# Patient Record
Sex: Female | Born: 1970 | Race: White | Hispanic: No | Marital: Married | Smoking: Former smoker
Health system: Southern US, Community
[De-identification: ages and names within clinical notes are randomized; demographics above are authoritative.]

## PROBLEM LIST (undated history)

## (undated) DIAGNOSIS — N921 Excessive and frequent menstruation with irregular cycle: Secondary | ICD-10-CM

## (undated) DIAGNOSIS — B029 Zoster without complications: Secondary | ICD-10-CM

## (undated) DIAGNOSIS — N83202 Unspecified ovarian cyst, left side: Secondary | ICD-10-CM

## (undated) DIAGNOSIS — M1990 Unspecified osteoarthritis, unspecified site: Secondary | ICD-10-CM

## (undated) HISTORY — DX: Excessive and frequent menstruation with irregular cycle: N92.1

## (undated) HISTORY — DX: Unspecified osteoarthritis, unspecified site: M19.90

## (undated) HISTORY — PX: TUBAL LIGATION: SHX77

## (undated) HISTORY — DX: Unspecified ovarian cyst, left side: N83.202

## (undated) HISTORY — DX: Zoster without complications: B02.9

## (undated) HISTORY — PX: APPENDECTOMY: SHX54

---

## 1998-11-22 HISTORY — PX: LAPAROSCOPIC OOPHERECTOMY: SHX6507

## 2012-06-16 ENCOUNTER — Other Ambulatory Visit: Payer: Self-pay | Admitting: Gastroenterology

## 2012-06-16 DIAGNOSIS — R140 Abdominal distension (gaseous): Secondary | ICD-10-CM

## 2012-06-21 ENCOUNTER — Ambulatory Visit
Admission: RE | Admit: 2012-06-21 | Discharge: 2012-06-21 | Disposition: A | Discharge status: Home / Self Care | Payer: BC Managed Care – PPO | Source: Ambulatory Visit | Attending: Gastroenterology | Admitting: Gastroenterology

## 2012-06-21 DIAGNOSIS — R140 Abdominal distension (gaseous): Secondary | ICD-10-CM

## 2012-12-13 ENCOUNTER — Encounter: Payer: Self-pay | Admitting: Family Medicine

## 2013-08-25 IMAGING — US US ABDOMEN COMPLETE
1 series · 13 of 25 positions shown · non-contrast
Comparison: None.

CLINICAL DATA: Abdominal bloating, former smoking history

COMPLETE ABDOMINAL ULTRASOUND

[Series 1: us abdomen complete · 0.26mm/px · 13 of 74 slices shown]
[im 1/74]
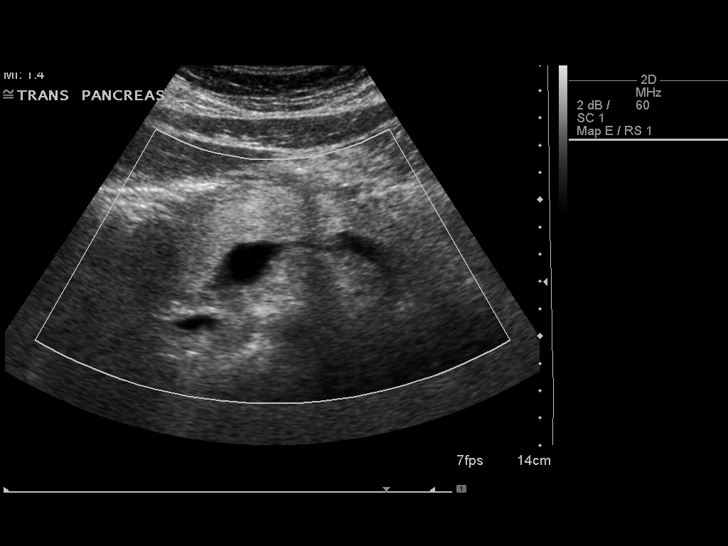
[im 7/74]
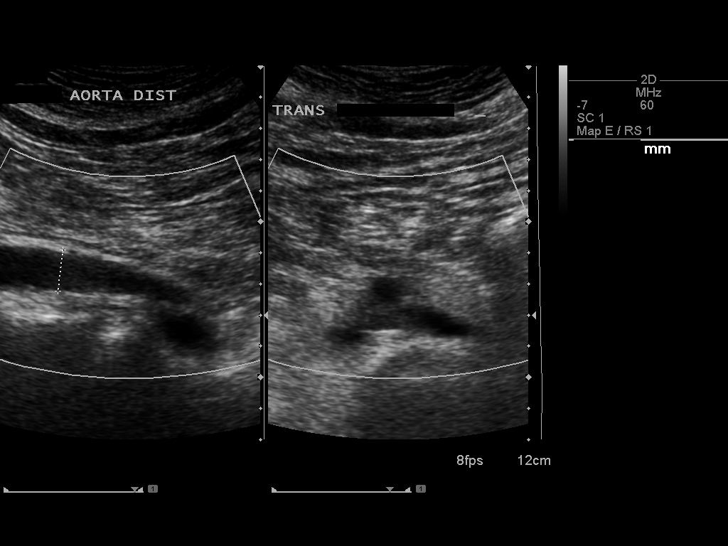
[im 13/74]
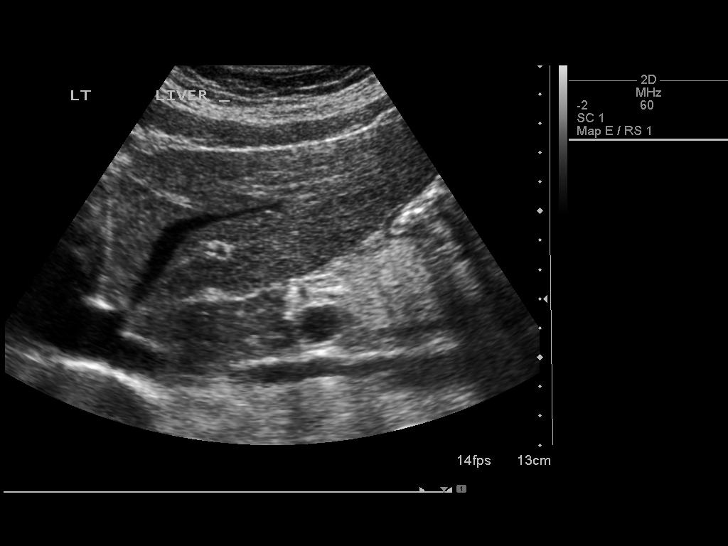
[im 19/74]
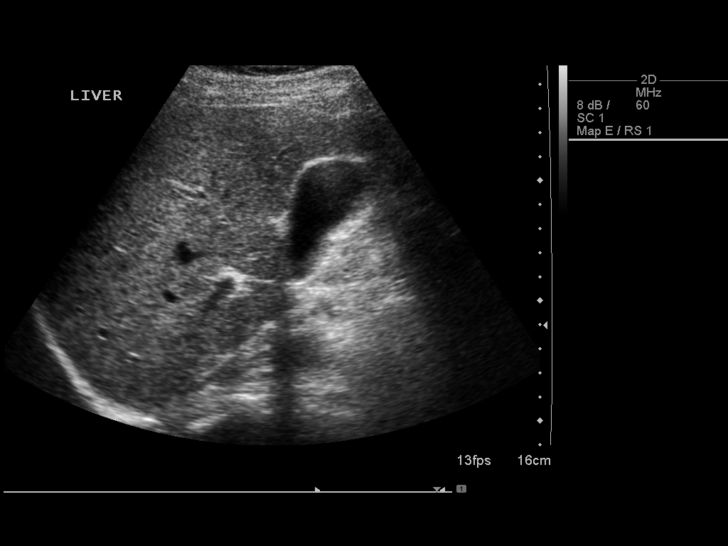
[im 25/74]
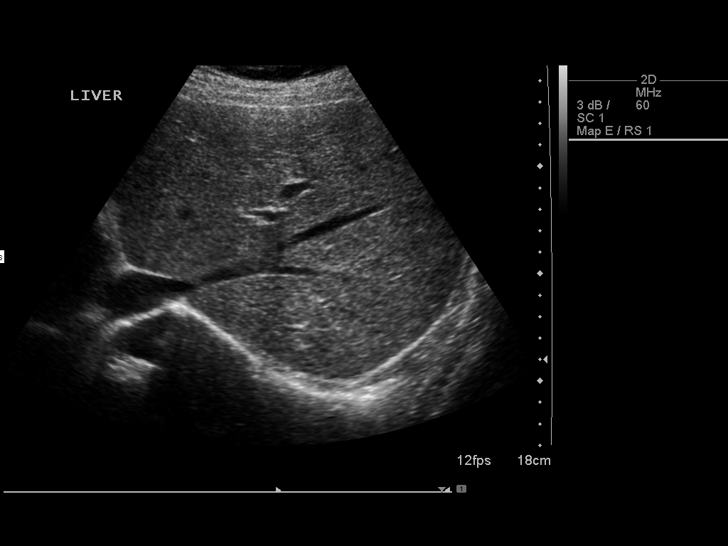
[im 31/74]
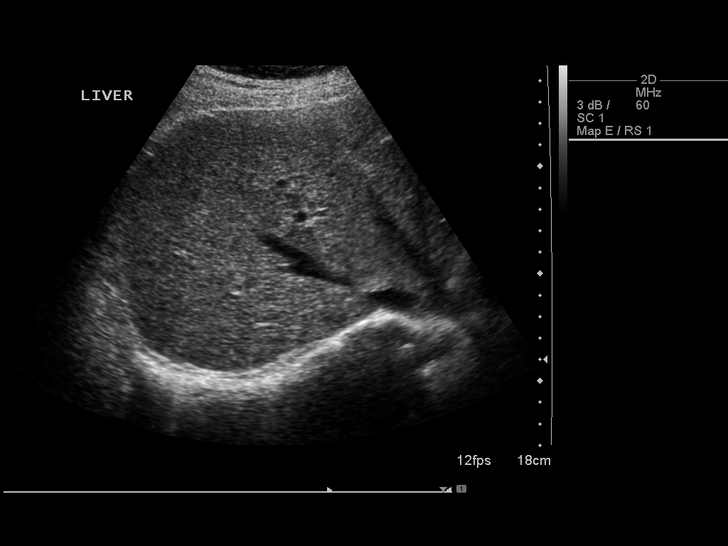
[im 37/74]
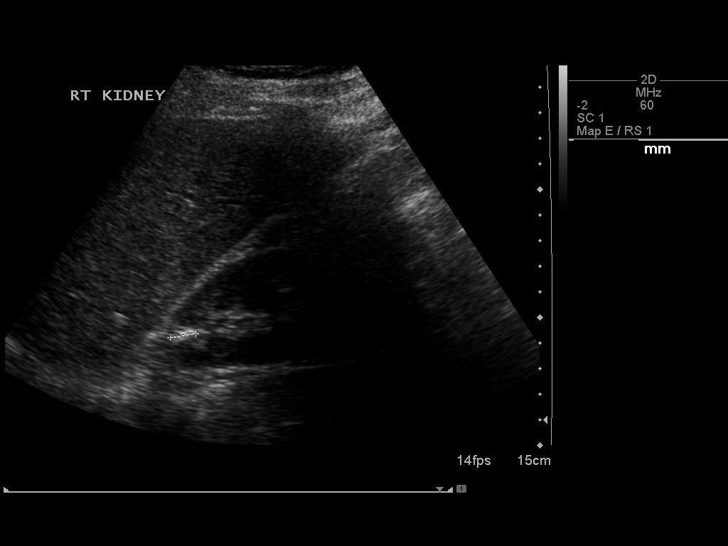
[im 43/74]
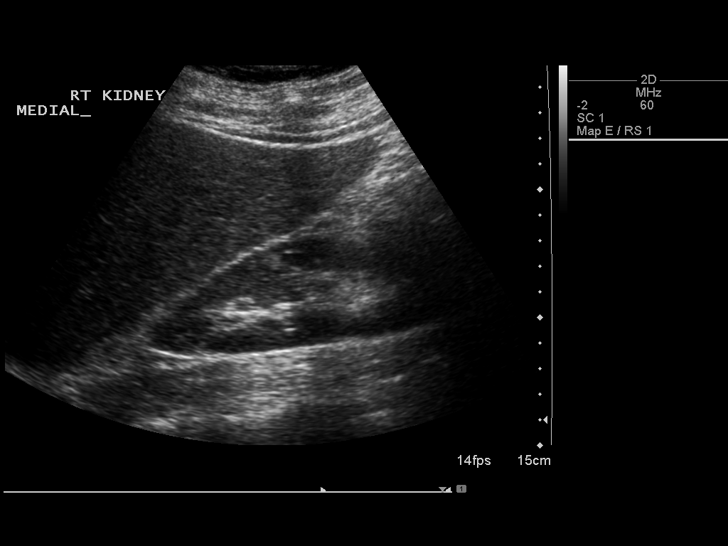
[im 49/74]
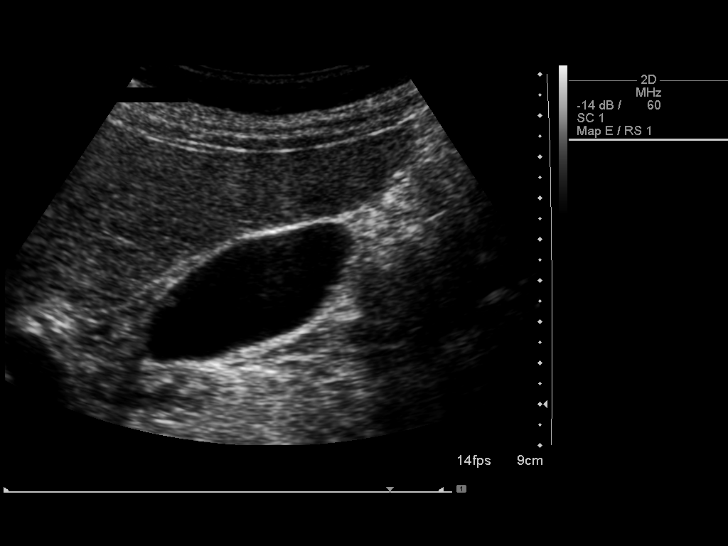
[im 55/74]
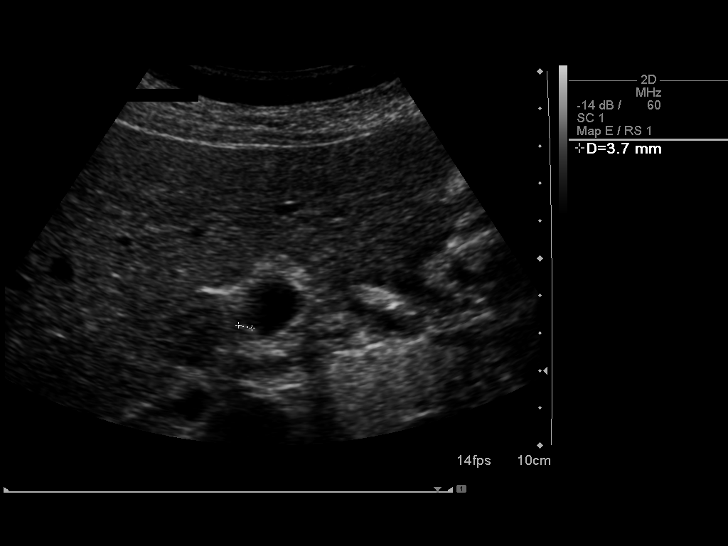
[im 61/74]
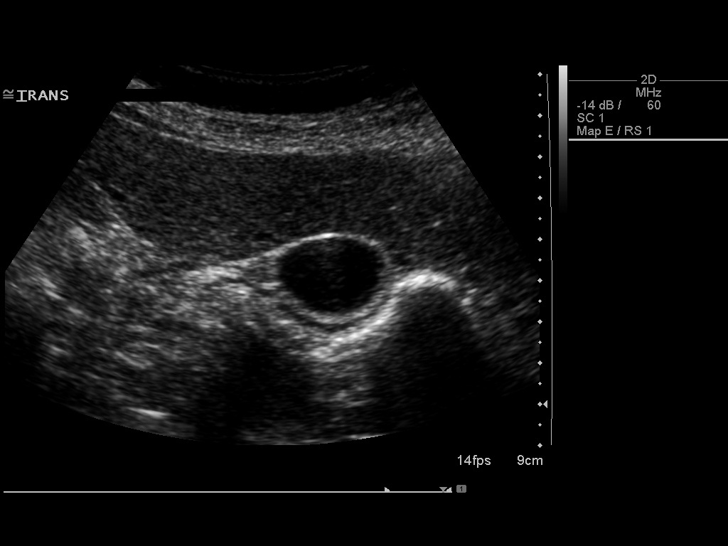
[im 67/74]
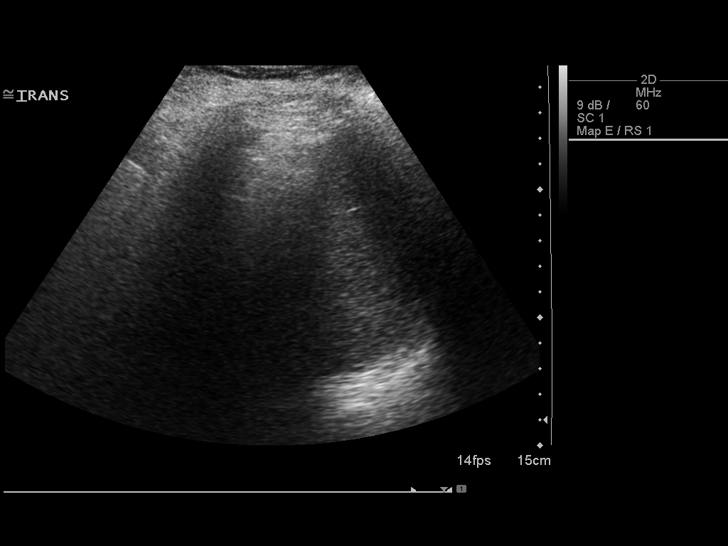
[im 74/74]
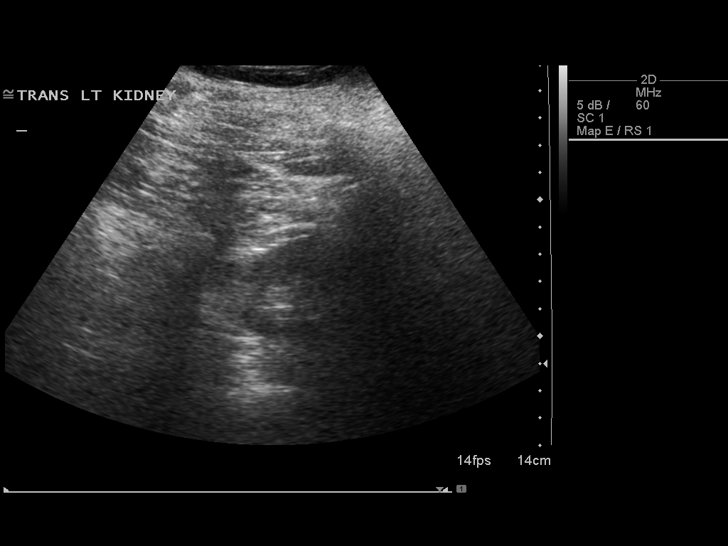

[13 of 25 positions shown; findings below may reference images not displayed]

FINDINGS: Gallbladder:  The gallbladder is visualized and no gallstones are
noted.  There does appear to be a single soft tissue focus within
the gallbladder which does not move and most like represents a 4 mm
gallbladder polyp.  There is no pain over the gallbladder with
compression.

Common bile duct:  The common bile duct is normal measuring 3.1 mm
in diameter.

Liver:  The liver has a normal echogenic pattern.  No ductal
dilatation is seen.

IVC:  Appears normal.

Pancreas:  No focal abnormality seen.

Spleen:  The spleen is normal measuring 3.9 cm sagittally.

Right Kidney:  No hydronephrosis is seen.  The right kidney
measures 9.9 cm sagittally.  A probable calculus which is
nonobstructing in the upper pole measures 10 mm in diameter.

Left Kidney:  No hydronephrosis is seen.  The left kidney measures
11.3 cm.

Abdominal aorta:  The abdominal aorta is normal in caliber.
IMPRESSION: 1.  No gallstones.  Probable 4 mm gallbladder polyp.  No pain over
the gallbladder.
2.  No ductal dilatation.
3.  Nonobstructing 10 mm right upper pole renal calculus.

## 2016-03-11 ENCOUNTER — Encounter: Payer: Self-pay | Admitting: Family Medicine

## 2016-03-29 HISTORY — PX: ENDOMETRIAL BIOPSY: SHX622

## 2018-02-06 ENCOUNTER — Emergency Department (HOSPITAL_BASED_OUTPATIENT_CLINIC_OR_DEPARTMENT_OTHER)
Admit: 2018-02-06 | Discharge: 2018-02-06 | Disposition: A | Discharge status: Home / Self Care | Payer: BLUE CROSS/BLUE SHIELD | Attending: Emergency Medicine | Admitting: Emergency Medicine

## 2018-02-06 ENCOUNTER — Emergency Department (HOSPITAL_COMMUNITY)
Admission: EM | Admit: 2018-02-06 | Discharge: 2018-02-06 | Disposition: A | Discharge status: Home / Self Care | Payer: BLUE CROSS/BLUE SHIELD | Source: Home / Self Care | Attending: Emergency Medicine | Admitting: Emergency Medicine

## 2018-02-06 ENCOUNTER — Emergency Department (HOSPITAL_COMMUNITY)
Admission: EM | Admit: 2018-02-06 | Discharge: 2018-02-06 | Disposition: A | Discharge status: Home / Self Care | Payer: BLUE CROSS/BLUE SHIELD | Attending: Emergency Medicine | Admitting: Emergency Medicine

## 2018-02-06 ENCOUNTER — Encounter (HOSPITAL_COMMUNITY): Payer: Self-pay

## 2018-02-06 ENCOUNTER — Other Ambulatory Visit: Payer: Self-pay

## 2018-02-06 DIAGNOSIS — M25561 Pain in right knee: Secondary | ICD-10-CM

## 2018-02-06 DIAGNOSIS — M79609 Pain in unspecified limb: Secondary | ICD-10-CM

## 2018-02-06 DIAGNOSIS — M79661 Pain in right lower leg: Secondary | ICD-10-CM | POA: Diagnosis present

## 2018-02-06 MED ORDER — IBUPROFEN 600 MG PO TABS: 600.0000 mg | ORAL_TABLET | Freq: Four times a day (QID) | ORAL | 0 refills | Status: DC | PRN

## 2018-02-06 MED ORDER — IBUPROFEN 800 MG PO TABS
800.0000 mg | ORAL_TABLET | Freq: Once | ORAL | Status: AC
  Administered 2018-02-06: 800 mg via ORAL
  Filled 2018-02-06: qty 1

## 2018-02-06 MED ORDER — CYCLOBENZAPRINE HCL 10 MG PO TABS: 10.0000 mg | ORAL_TABLET | Freq: Two times a day (BID) | ORAL | 0 refills | Status: DC | PRN

## 2018-02-06 NOTE — ED Triage Notes (Signed)
Pt endorses right leg pain just below the knee that feels like a "charlie horse" Unable to palpate pedal pulse. Leg is warm and dry.

## 2018-02-06 NOTE — ED Provider Notes (Signed)
Patient placed in Quick Look pathway, seen and evaluated   Chief Complaint: knee/leg pain  HPI:   Medial knee pain now radiating to right calf and posterior knee worse with walking and touch, alleviating with rest. Has been sitting down at work for long periods of time working on a project. No h/o DVT/PE, estrogen use, malignancy, recent immobilization/travel.  No recent trauma or exertional activity.   ROS: No fevers, chills, numbness, paresthesias, swelling, redness to extremity.   Physical Exam:   Gen: No distress  Neuro: Awake and Alert  Skin: Warm    Focused Exam: trace pitting edema symmetric bilaterally to shins. 2+ Dp and PT pulses bilaterally. TTP to right calf, medial knee and posterior distal thigh. Strength and sensation grossly intact in lower extremities. Varicosities noted to medial aspect of knee/proximal lower leg.    R LE vascular ultrasound ordered. Initiation of care has begun. The patient has been counseled on the process, plan, and necessity for staying for the completion/evaluation, and the remainder of the medical screening examination    Briana Nielsen, Briana Mamaril J, PA-C 02/06/18 1511    Melene PlanFloyd, Dan, DO 02/06/18 1529

## 2018-02-06 NOTE — ED Notes (Signed)
Pt called in lobby. No response 

## 2018-02-06 NOTE — ED Notes (Signed)
Pt. Came to nurse 1st and stated, Lavenia Atlasve been here for awhile and nobody has called. Pt was discharge due to no answer. Pt stated, Im hard of hearing and didn't hear anything. Pt put back into the system. Charge nurse notified.

## 2018-02-06 NOTE — Progress Notes (Signed)
Right lower extremity venous duplex has been completed. Negative for DVT. Results were given to Eyvonne MechanicJeffrey Hedges PA.  02/06/18 4:52 PM Olen CordialGreg Javonta Gronau RVT

## 2018-02-06 NOTE — ED Notes (Signed)
Second call in lobby. No response. 

## 2018-02-06 NOTE — ED Provider Notes (Signed)
[facility] EMERGENCY DEPARTMENT Provider Note   CSN: [id number] Arrival date & time: 02/06/18  1420     History   Chief Complaint Chief Complaint  Patient presents with  . Leg Pain    HPI Briana Nielsen is a 47 y.o. female.  HPI  The patient is a 47 year old female, she denies any significant prior medical history though she does report that her mother was recently diagnosed with polymyositis.  The patient reports that she has had approximately 1 year of aches and pains over her muscles, she has become sensitive to touch even with her cat stepping on her legs it does cause some pain.  That being said she has stayed fairly docile with her job, she sits at a desk, she does not do much in the way of exercise ever.  Over the last couple of days she has had increasing amounts of pain on the medial surface of her right knee, she felt like this might be related to some varicose veins though she did not see any.  The pain increased and it became gradually more difficult for her to bend her knee and move around.  She has not been taking any specific medications for this but became concerned that she may have a blood clot.  She denies fevers chills redness or any other symptoms including coughing shortness of breath or chest pain  History reviewed. No pertinent past medical history.  There are no active problems to display for this patient.   History reviewed. No pertinent surgical history.  OB History    No data available       Home Medications    Prior to Admission medications   Medication Sig Start Date End Date Taking? Authorizing Provider  cyclobenzaprine (FLEXERIL) 10 MG tablet Take 1 tablet (10 mg total) by mouth 2 (two) times daily as needed for muscle spasms. 02/06/18   Eber Hong, MD  ibuprofen (ADVIL,MOTRIN) 600 MG tablet Take 1 tablet (600 mg total) by mouth every 6 (six) hours as needed. 02/06/18   Eber Hong, MD    Family History History reviewed.  No pertinent family history.  Social History Social History   Tobacco Use  . Smoking status: Never Smoker  Substance Use Topics  . Alcohol use: No    Frequency: Never  . Drug use: No     Allergies   Patient has no known allergies.   Review of Systems Review of Systems  All other systems reviewed and are negative.    Physical Exam Updated Vital Signs BP (!) 154/100 (BP Location: Right Arm)   Pulse 80   Temp 98 F (36.7 C) (Oral)   Resp 16   SpO2 99%   Physical Exam  Constitutional: She appears well-developed and well-nourished. No distress.  HENT:  Head: Normocephalic and atraumatic.  Mouth/Throat: Oropharynx is clear and moist. No oropharyngeal exudate.  Eyes: Conjunctivae and EOM are normal. Pupils are equal, round, and reactive to light. Right eye exhibits no discharge. Left eye exhibits no discharge. No scleral icterus.  Neck: Normal range of motion. Neck supple. No JVD present. No thyromegaly present.  Cardiovascular: Normal rate, regular rhythm, normal heart sounds and intact distal pulses. Exam reveals no gallop and no friction rub.  No murmur heard. Pulmonary/Chest: Effort normal and breath sounds normal. No respiratory distress. She has no wheezes. She has no rales.  Abdominal: Soft. Bowel sounds are normal. She exhibits no distension and no mass. There is no tenderness.  Musculoskeletal:  Normal range of motion. She exhibits edema and tenderness ( Tenderness to palpation over the medial surface of the right knee, there is minimal pain with movement of the patella, she is able to flex and extend her knee with minimal difficulty).  Bilateral mild lower extremity edema.  Full range of motion of the bilateral lower extremities with only mild tenderness with range of motion of the right knee.  No tenderness with range of motion of the bilateral ankles or hips  Lymphadenopathy:    She has no cervical adenopathy.  Neurological: She is alert. Coordination normal.    Skin: Skin is warm and dry. No rash noted. No erythema.  Psychiatric: She has a normal mood and affect. Her behavior is normal.  Nursing note and vitals reviewed.    ED Treatments / Results  Labs (all labs ordered are listed, but only abnormal results are displayed) Labs Reviewed - No data to display    Radiology No results found.  Procedures Procedures (including critical care time)  Medications Ordered in ED Medications  ibuprofen (ADVIL,MOTRIN) tablet 800 mg (not administered)     Initial Impression / Assessment and Plan / ED Course  I have reviewed the triage vital signs and the nursing notes.  Pertinent labs & imaging results that were available during my care of the patient were reviewed by me and considered in my medical decision making (see chart for details).     The patient was screened on arrival by physician assistant, and ultrasound was appropriately ordered of the right lower extremity which was negative for venous thromboembolism.  The patient does have some tenderness around the knee, this raises suspicion for a muscular skeletal etiology but furthermore the patient's family history of rheumatologic disease suggest that she may have something underlying as well.  She may have underlying disease as well and will need to be followed up with an orthopedist, may need physical therapy, family doctor referral given as well [facility]  Final Clinical Impressions(s) / ED Diagnoses   Final diagnoses:  Acute pain of right knee    ED Discharge Orders        Ordered    ibuprofen (ADVIL,MOTRIN) 600 MG tablet  Every 6 hours PRN     02/06/18 2033    cyclobenzaprine (FLEXERIL) 10 MG tablet  2 times daily PRN     02/06/18 2033       Eber HongMiller, Christopher Glasscock, MD 02/06/18 2035

## 2018-02-06 NOTE — Discharge Instructions (Signed)
Ibuprofen every 8 hours as needed for pain Flexeril 10mg  at night before bed.

## 2018-02-13 DIAGNOSIS — M25561 Pain in right knee: Secondary | ICD-10-CM | POA: Diagnosis not present

## 2018-02-21 ENCOUNTER — Ambulatory Visit (INDEPENDENT_AMBULATORY_CARE_PROVIDER_SITE_OTHER): Payer: Self-pay | Admitting: Family Medicine

## 2018-02-21 ENCOUNTER — Encounter: Payer: Self-pay | Admitting: Family Medicine

## 2018-02-21 VITALS — BP 131/85 | HR 74 | Temp 98.1°F | Ht 67.0 in | Wt 226.8 lb

## 2018-02-21 DIAGNOSIS — Z130 Encounter for screening for diseases of the blood and blood-forming organs and certain disorders involving the immune mechanism: Secondary | ICD-10-CM

## 2018-02-21 DIAGNOSIS — Z2821 Immunization not carried out because of patient refusal: Secondary | ICD-10-CM | POA: Insufficient documentation

## 2018-02-21 DIAGNOSIS — Z800 Family history of malignant neoplasm of digestive organs: Secondary | ICD-10-CM | POA: Insufficient documentation

## 2018-02-21 DIAGNOSIS — R140 Abdominal distension (gaseous): Secondary | ICD-10-CM

## 2018-02-21 DIAGNOSIS — N951 Menopausal and female climacteric states: Secondary | ICD-10-CM

## 2018-02-21 DIAGNOSIS — Z13220 Encounter for screening for lipoid disorders: Secondary | ICD-10-CM

## 2018-02-21 DIAGNOSIS — I83811 Varicose veins of right lower extremities with pain: Secondary | ICD-10-CM

## 2018-02-21 DIAGNOSIS — Z7689 Persons encountering health services in other specified circumstances: Secondary | ICD-10-CM

## 2018-02-21 DIAGNOSIS — Z6835 Body mass index (BMI) 35.0-35.9, adult: Secondary | ICD-10-CM | POA: Insufficient documentation

## 2018-02-21 DIAGNOSIS — M1711 Unilateral primary osteoarthritis, right knee: Secondary | ICD-10-CM

## 2018-02-21 DIAGNOSIS — Z114 Encounter for screening for human immunodeficiency virus [HIV]: Secondary | ICD-10-CM

## 2018-02-21 LAB — BAYER DCA HB A1C WAIVED: HB A1C (BAYER DCA - WAIVED): 5.2 % (ref <7.0)

## 2018-02-21 MED ORDER — VALACYCLOVIR HCL 1 G PO TABS: 1000.0000 mg | ORAL_TABLET | Freq: Two times a day (BID) | ORAL | 2 refills | Status: AC

## 2018-02-21 NOTE — Patient Instructions (Addendum)
You had labs performed today.  You will be contacted with the results of the labs once they are available, usually in the next 3 days for routine lab work.  Schedule your mammogram when you check out today.  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  I have placed a referral to the gastroenterologist for your abdominal bloating and family history of anal cancer.  Health Maintenance, Female Adopting a healthy lifestyle and getting preventive care can go a long way to promote health and wellness. Talk with your health care provider about what schedule of regular examinations is right for you. This is a good chance for you to check in with your provider about disease prevention and staying healthy. In between checkups, there are plenty of things you can do on your own. Experts have done a lot of research about which lifestyle changes and preventive measures are most likely to keep you healthy. Ask your health care provider for more information. Weight and diet Eat a healthy diet  Be sure to include plenty of vegetables, fruits, low-fat dairy products, and lean protein.  Do not eat a lot of foods high in solid fats, added sugars, or salt.  Get regular exercise. This is one of the most important things you can do for your health. ? Most adults should exercise for at least 150 minutes each week. The exercise should increase your heart rate and make you sweat (moderate-intensity exercise). ? Most adults should also do strengthening exercises at least twice a week. This is in addition to the moderate-intensity exercise.  Maintain a healthy weight  Body mass index (BMI) is a measurement that can be used to identify possible weight problems. It estimates body fat based on height and weight. Your health care provider can help determine your BMI and help you achieve or maintain a healthy weight.  For females 37 years of age and older: ? A BMI below 18.5 is considered underweight. ? A BMI of 18.5 to  24.9 is normal. ? A BMI of 25 to 29.9 is considered overweight. ? A BMI of 30 and above is considered obese.  Watch levels of cholesterol and blood lipids  You should start having your blood tested for lipids and cholesterol at 49 years of age, then have this test every 5 years.  You may need to have your cholesterol levels checked more often if: ? Your lipid or cholesterol levels are high. ? You are older than 49 years of age. ? You are at high risk for heart disease.  Cancer screening Lung Cancer  Lung cancer screening is recommended for adults 71-2 years old who are at high risk for lung cancer because of a history of smoking.  A yearly low-dose CT scan of the lungs is recommended for people who: ? Currently smoke. ? Have quit within the past 15 years. ? Have at least a 30-pack-year history of smoking. A pack year is smoking an average of one pack of cigarettes a day for 1 year.  Yearly screening should continue until it has been 15 years since you quit.  Yearly screening should stop if you develop a health problem that would prevent you from having lung cancer treatment.  Breast Cancer  Practice breast self-awareness. This means understanding how your breasts normally appear and feel.  It also means doing regular breast self-exams. Let your health care provider know about any changes, no matter how small.  If you are in your 20s or 30s, you should  have a clinical breast exam (CBE) by a health care provider every 1-3 years as part of a regular health exam.  If you are 18 or older, have a CBE every year. Also consider having a breast X-ray (mammogram) every year.  If you have a family history of breast cancer, talk to your health care provider about genetic screening.  If you are at high risk for breast cancer, talk to your health care provider about having an MRI and a mammogram every year.  Breast cancer gene (BRCA) assessment is recommended for women who have family  members with BRCA-related cancers. BRCA-related cancers include: ? Breast. ? Ovarian. ? Tubal. ? Peritoneal cancers.  Results of the assessment will determine the need for genetic counseling and BRCA1 and BRCA2 testing.  Cervical Cancer Your health care provider may recommend that you be screened regularly for cancer of the pelvic organs (ovaries, uterus, and vagina). This screening involves a pelvic examination, including checking for microscopic changes to the surface of your cervix (Pap test). You may be encouraged to have this screening done every 3 years, beginning at age 40.  For women ages 37-65, health care providers may recommend pelvic exams and Pap testing every 3 years, or they may recommend the Pap and pelvic exam, combined with testing for human papilloma virus (HPV), every 5 years. Some types of HPV increase your risk of cervical cancer. Testing for HPV may also be done on women of any age with unclear Pap test results.  Other health care providers may not recommend any screening for nonpregnant women who are considered low risk for pelvic cancer and who do not have symptoms. Ask your health care provider if a screening pelvic exam is right for you.  If you have had past treatment for cervical cancer or a condition that could lead to cancer, you need Pap tests and screening for cancer for at least 20 years after your treatment. If Pap tests have been discontinued, your risk factors (such as having a new sexual partner) need to be reassessed to determine if screening should resume. Some women have medical problems that increase the chance of getting cervical cancer. In these cases, your health care provider may recommend more frequent screening and Pap tests.  Colorectal Cancer  This type of cancer can be detected and often prevented.  Routine colorectal cancer screening usually begins at 49 years of age and continues through 49 years of age.  Your health care provider may  recommend screening at an earlier age if you have risk factors for colon cancer.  Your health care provider may also recommend using home test kits to check for hidden blood in the stool.  A small camera at the end of a tube can be used to examine your colon directly (sigmoidoscopy or colonoscopy). This is done to check for the earliest forms of colorectal cancer.  Routine screening usually begins at age 47.  Direct examination of the colon should be repeated every 5-10 years through 49 years of age. However, you may need to be screened more often if early forms of precancerous polyps or small growths are found.  Skin Cancer  Check your skin from head to toe regularly.  Tell your health care provider about any new moles or changes in moles, especially if there is a change in a mole's shape or color.  Also tell your health care provider if you have a mole that is larger than the size of a pencil eraser.  Always  use sunscreen. Apply sunscreen liberally and repeatedly throughout the day.  Protect yourself by wearing long sleeves, pants, a wide-brimmed hat, and sunglasses whenever you are outside.  Heart disease, diabetes, and high blood pressure  High blood pressure causes heart disease and increases the risk of stroke. High blood pressure is more likely to develop in: ? People who have blood pressure in the high end of the normal range (130-139/85-89 mm Hg). ? People who are overweight or obese. ? People who are African American.  If you are 62-58 years of age, have your blood pressure checked every 3-5 years. If you are 52 years of age or older, have your blood pressure checked every year. You should have your blood pressure measured twice-once when you are at a hospital or clinic, and once when you are not at a hospital or clinic. Record the average of the two measurements. To check your blood pressure when you are not at a hospital or clinic, you can use: ? An automated blood pressure  machine at a pharmacy. ? A home blood pressure monitor.  If you are between 74 years and 46 years old, ask your health care provider if you should take aspirin to prevent strokes.  Have regular diabetes screenings. This involves taking a blood sample to check your fasting blood sugar level. ? If you are at a normal weight and have a low risk for diabetes, have this test once every three years after 49 years of age. ? If you are overweight and have a high risk for diabetes, consider being tested at a younger age or more often. Preventing infection Hepatitis B  If you have a higher risk for hepatitis B, you should be screened for this virus. You are considered at high risk for hepatitis B if: ? You were born in a country where hepatitis B is common. Ask your health care provider which countries are considered high risk. ? Your parents were born in a high-risk country, and you have not been immunized against hepatitis B (hepatitis B vaccine). ? You have HIV or AIDS. ? You use needles to inject street drugs. ? You live with someone who has hepatitis B. ? You have had sex with someone who has hepatitis B. ? You get hemodialysis treatment. ? You take certain medicines for conditions, including cancer, organ transplantation, and autoimmune conditions.  Hepatitis C  Blood testing is recommended for: ? Everyone born from [birth year] through 1967. ? Anyone with known risk factors for hepatitis C.  Sexually transmitted infections (STIs)  You should be screened for sexually transmitted infections (STIs) including gonorrhea and chlamydia if: ? You are sexually active and are younger than 49 years of age. ? You are older than 50 years of age and your health care provider tells you that you are at risk for this type of infection. ? Your sexual activity has changed since you were last screened and you are at an increased risk for chlamydia or gonorrhea. Ask your health care provider if you are at  risk.  If you do not have HIV, but are at risk, it may be recommended that you take a prescription medicine daily to prevent HIV infection. This is called pre-exposure prophylaxis (PrEP). You are considered at risk if: ? You are sexually active and do not regularly use condoms or know the HIV status of your partner(s). ? You take drugs by injection. ? You are sexually active with a partner who has HIV.  Talk with your  health care provider about whether you are at high risk of being infected with HIV. If you choose to begin PrEP, you should first be tested for HIV. You should then be tested every 3 months for as long as you are taking PrEP. Pregnancy  If you are premenopausal and you may become pregnant, ask your health care provider about preconception counseling.  If you may become pregnant, take 400 to 800 micrograms (mcg) of folic acid every day.  If you want to prevent pregnancy, talk to your health care provider about birth control (contraception). Osteoporosis and menopause  Osteoporosis is a disease in which the bones lose minerals and strength with aging. This can result in serious bone fractures. Your risk for osteoporosis can be identified using a bone density scan.  If you are 60 years of age or older, or if you are at risk for osteoporosis and fractures, ask your health care provider if you should be screened.  Ask your health care provider whether you should take a calcium or vitamin D supplement to lower your risk for osteoporosis.  Menopause may have certain physical symptoms and risks.  Hormone replacement therapy may reduce some of these symptoms and risks. Talk to your health care provider about whether hormone replacement therapy is right for you. Follow these instructions at home:  Schedule regular health, dental, and eye exams.  Stay current with your immunizations.  Do not use any tobacco products including cigarettes, chewing tobacco, or electronic  cigarettes.  If you are pregnant, do not drink alcohol.  If you are breastfeeding, limit how much and how often you drink alcohol.  Limit alcohol intake to no more than 1 drink per day for nonpregnant women. One drink equals 12 ounces of beer, 5 ounces of wine, or 1 ounces of hard liquor.  Do not use street drugs.  Do not share needles.  Ask your health care provider for help if you need support or information about quitting drugs.  Tell your health care provider if you often feel depressed.  Tell your health care provider if you have ever been abused or do not feel safe at home. This information is not intended to replace advice given to you by your health care provider. Make sure you discuss any questions you have with your health care provider. Document Released: 05/24/2011 Document Revised: 04/15/2016 Document Reviewed: 08/12/2015 Elsevier Interactive Patient Education  [employer].

## 2018-02-21 NOTE — Progress Notes (Signed)
Subjective: YH:[id number] care, herpes rash HPI: Briana Nielsen is a 47 y.o. female presenting to clinic today for:  1.  Herpes rash Patient notes that she has had a "herpes zoster rash" on her buttock that appears intermittently and has done so for years.  She notes that her previous provider told her that this was an atypical herpes zoster rash and gave her Valtrex to use as needed.  She would like to know she can get that today for future flares.  Denies having more than 5 flares per year.  Does not wish to be on suppressive therapy.  Denies genital or oral herpes lesions.  2.  Right knee/lower extremity pain Patient was seen in the emergency department last month for right knee pain.  She had a venous ultrasound to rule out DVT.  This was negative.  She was sent to orthopedist for further evaluation and was told that she had arthritis in this knee.  She was given a steroid injection into the knee and notes that she has not had substantial improvement with it but has had some improvement with use of oral NSAID and muscle relaxer.  She notes that symptoms seem to improve for a while but then then she discontinued the medication and they have since returned.  She describes the pain as a burning pain and points to the medial aspect of the right knee.  She notes that she has varicose veins in this region and is unsure as to whether or not varicose vein is causing knee pain or vice versa.  3.  Abdominal bloating Patient notes that she has had substantial abdominal bloating and noises in the past.  Denies constipation or diarrhea.  No nausea or vomiting.  No unplanned weight loss.  No melena or hematochezia.  She notes her mother has a history of anal cancer.  She would like to be evaluated by gastroenterology.  4.  Preventive care Patient notes it has been several years since her last mammogram.  Denies history of abnormal mammograms.  She notes that her last Pap smear was about 2 years ago and was  negative.  She declines Tdap and states that she does not wish to have vaccinations.  Additionally, she thinks that she is perimenopausal citing that last menstrual cycle was about 9 months ago.  Past Medical History:  Diagnosis Date  . Arthritis    right knee  . Herpes zoster    Past Surgical History:  Procedure Laterality Date  . APPENDECTOMY    . CESAREAN SECTION    . LAPAROSCOPIC OOPHERECTOMY  2000   Social History   Socioeconomic History  . Marital status: Married    Spouse name: Not on file  . Number of children: Not on file  . Years of education: Not on file  . Highest education level: Not on file  Occupational History  . Not on file  Social Needs  . Financial resource strain: Not on file  . Food insecurity:    Worry: Not on file    Inability: Not on file  . Transportation needs:    Medical: Not on file    Non-medical: Not on file  Tobacco Use  . Smoking status: Former Smoker    Last attempt to quit: 02/22/2012    Years since quitting: 6.0  . Smokeless tobacco: Never Used  Substance and Sexual Activity  . Alcohol use: No    Frequency: Never  . Drug use: Yes    Types: Marijuana  Comment: every now & then  . Sexual activity: Yes  Lifestyle  . Physical activity:    Days per week: Not on file    Minutes per session: Not on file  . Stress: Not on file  Relationships  . Social connections:    Talks on phone: Not on file    Gets together: Not on file    Attends religious service: Not on file    Active member of club or organization: Not on file    Attends meetings of clubs or organizations: Not on file    Relationship status: Not on file  . Intimate partner violence:    Fear of current or ex partner: Not on file    Emotionally abused: Not on file    Physically abused: Not on file    Forced sexual activity: Not on file  Other Topics Concern  . Not on file  Social History Narrative  . Not on file   Current Meds  Medication Sig  . meloxicam (MOBIC) 15  MG tablet Take 15 mg by mouth daily.   Family History  Problem Relation Age of Onset  . Cancer Mother        anal  . Dermatomyositis Mother   . Mental illness Sister   . Drug abuse Sister   . Breast cancer Maternal Grandmother 60  . Pancreatic cancer Maternal Aunt    No Known Allergies   Health Maintenance: Mammogram, Tdap  ROS: Per HPI  Objective: Office vital signs reviewed. BP 131/85   Pulse 74   Temp 98.1 F (36.7 C) (Oral)   Ht _0  (1.702 m)   Wt 226 lb 12.8 oz (102.9 kg)   LMP 06/23/2017   BMI 35.52 kg/m   Physical Examination:  General: Awake, alert, obese, No acute distress HEENT: Normal    Neck: No masses palpated. No lymphadenopathy    Ears: L Tympanic membrane intact, normal light reflex, no erythema, no bulging; R TM occluded by cerumen    Eyes: PERRLA, extraocular movement in tact, sclera white    Nose: nasal turbinates moist, no nasal discharge    Throat: moist mucus membranes, no erythema, no tonsillar exudate.  Airway is patent Cardio: regular rate and rhythm, S1S2 heard, no murmurs appreciated Pulm: clear to auscultation bilaterally, no wheezes, rhonchi or rales; normal work of breathing on room air GI: obese, soft, non-tender, non-distended, bowel sounds present x4, no hepatomegaly, no splenomegaly, no masses Extremities: warm, well perfused, No edema, cyanosis or clubbing; +2 pulses bilaterally MSK: normal gait and [place]  Right knee: No gross effusions, swelling or erythema noted.  There is a small cluster varicose veins appreciated along the medial aspect of the lower leg.  No associated erythema or induration. Skin: dry; intact; no rashes or lesions Neuro: no focal neurologic deficits. patellar DTRs 2/4 Psych: mood stable, speech normal, affect appropriate    Office Visit from 02/21/2018 in [facility]  PHQ-2 Total Score  2     Assessment/ Plan: 47 y.o. female   1. Primary osteoarthritis of right knee Is  established with orthopedics.  She will follow up with them as needed.  2. Establishing care with new doctor, encounter for Release of information form completed.  Will obtain previous records from previous PCP and OB/GYN.  3. BMI 35.0-35.9,adult - CMP14+EGFR - TSH - Lipid Panel - Bayer DCA Hb A1c Waived  4. Screening for lipid disorders - Lipid Panel  5. Screening for HIV (human immunodeficiency virus) -  HIV antibody  6. Screening, anemia, deficiency, iron - CBC with Differential  7. Family history of cancer of GI tract Mother with hx anal cancer. - Ambulatory referral to Gastroenterology  8. Abdominal bloating - Ambulatory referral to Gastroenterology  9. Refuses tetanus, diphtheria, and acellular pertussis (Tdap) vaccination Counseling performed.  Patient to let me know if she changes her mind about this vaccine  10. Varicose veins of right lower extremity with pain I offered a referral to vascular to discuss treatment plans.  Patient wishes to work on above labs and screening test prior to seeing specialist.   11. Perimenopausal We will continue to monitor.   Janora Norlander, DO [facility] [phone]

## 2018-02-22 LAB — CMP14+EGFR
ALBUMIN: 4.2 g/dL (ref 3.5–5.5)
ALT: 14 IU/L (ref 0–32)
AST: 14 IU/L (ref 0–40)
Albumin/Globulin Ratio: 1.9 (ref 1.2–2.2)
Alkaline Phosphatase: 77 IU/L (ref 39–117)
BUN / CREAT RATIO: 14 (ref 9–23)
BUN: 12 mg/dL (ref 6–24)
Bilirubin Total: <0.2 mg/dL (ref 0.0–1.2)
CALCIUM: 9.6 mg/dL (ref 8.7–10.2)
CO2: 24 mmol/L (ref 20–29)
CREATININE: 0.86 mg/dL (ref 0.57–1.00)
Chloride: 104 mmol/L (ref 96–106)
GFR, EST AFRICAN AMERICAN: 92 mL/min/{1.73_m2} (ref >59)
GFR, EST NON AFRICAN AMERICAN: 80 mL/min/{1.73_m2} (ref >59)
GLUCOSE: 96 mg/dL (ref 65–99)
Globulin, Total: 2.2 g/dL (ref 1.5–4.5)
Potassium: 4.2 mmol/L (ref 3.5–5.2)
Sodium: 142 mmol/L (ref 134–144)
TOTAL PROTEIN: 6.4 g/dL (ref 6.0–8.5)

## 2018-02-22 LAB — CBC WITH DIFFERENTIAL/PLATELET
BASOS ABS: 0.1 10*3/uL (ref 0.0–0.2)
BASOS: 1 %
EOS (ABSOLUTE): 0.3 10*3/uL (ref 0.0–0.4)
EOS: 4 %
Hematocrit: 40.2 % (ref 34.0–46.6)
Hemoglobin: 13.3 g/dL (ref 11.1–15.9)
IMMATURE GRANS (ABS): 0 10*3/uL (ref 0.0–0.1)
Immature Granulocytes: 0 %
LYMPHS: 35 %
Lymphocytes Absolute: 3 10*3/uL (ref 0.7–3.1)
MCH: 30.9 pg (ref 26.6–33.0)
MCHC: 33.1 g/dL (ref 31.5–35.7)
MCV: 93 fL (ref 79–97)
Monocytes Absolute: 0.8 10*3/uL (ref 0.1–0.9)
Monocytes: 9 %
NEUTROS ABS: 4.4 10*3/uL (ref 1.4–7.0)
Neutrophils: 51 %
Platelets: 290 10*3/uL (ref 150–379)
RBC: 4.31 x10E6/uL (ref 3.77–5.28)
RDW: 13.9 % (ref 12.3–15.4)
WBC: 8.5 10*3/uL (ref 3.4–10.8)

## 2018-02-22 LAB — LIPID PANEL
CHOL/HDL RATIO: 2.4 ratio (ref 0.0–4.4)
CHOLESTEROL TOTAL: 154 mg/dL (ref 100–199)
HDL: 65 mg/dL (ref >39)
LDL Calculated: 75 mg/dL (ref 0–99)
Triglycerides: 69 mg/dL (ref 0–149)
VLDL Cholesterol Cal: 14 mg/dL (ref 5–40)

## 2018-02-22 LAB — TSH: TSH: 1.85 u[IU]/mL (ref 0.450–4.500)

## 2018-02-22 LAB — HIV ANTIBODY (ROUTINE TESTING W REFLEX): HIV SCREEN 4TH GENERATION: NONREACTIVE

## 2018-02-23 ENCOUNTER — Encounter: Payer: Self-pay | Admitting: Family Medicine

## 2018-02-23 ENCOUNTER — Telehealth: Payer: Self-pay | Admitting: *Deleted

## 2018-02-23 NOTE — Telephone Encounter (Signed)
Pt notified of results Verbalizes understanding 

## 2018-02-23 NOTE — Telephone Encounter (Signed)
-----   Message from Raliegh IpAshly M Gottschalk, DO sent at 02/22/2018  1:11 PM EDT ----- Kidney function is normla.  Liver function is normal.  Fasting sugar is normal.  Thyroid function normal.  No evidence of anemia. HIV is negative.    Lipid panel revealed a normal HDL (good cholesterol that protects against stroke and heart attack).  LDL was normal (bad cholesterol that can put you at increased risk of stroke and heart attack).  Triglycerides were normal (this level correlates with how much sugar/ carbohydrates you eat/drink).  To maintain healthy levels patient should continue to work on diet and exercise.  Eat plenty of fruits and vegetables.  Avoid saturated fats and high carbohydrate foods.  Get at least 30 minutes of exercise daily.  ASCVD risk score (risk of stroke/ heart attack) in the next 10 years is 0.7%, this is considered low risk.  A cholesterol medication is NOT recommended at this time.

## 2018-02-24 ENCOUNTER — Encounter: Payer: Self-pay | Admitting: Family Medicine

## 2018-03-14 ENCOUNTER — Encounter: Payer: Self-pay | Admitting: Family Medicine

## 2018-03-14 DIAGNOSIS — G4733 Obstructive sleep apnea (adult) (pediatric): Secondary | ICD-10-CM | POA: Insufficient documentation

## 2018-04-18 DIAGNOSIS — Z1231 Encounter for screening mammogram for malignant neoplasm of breast: Secondary | ICD-10-CM | POA: Diagnosis not present

## 2018-04-18 LAB — HM MAMMOGRAPHY

## 2018-05-03 DIAGNOSIS — M5431 Sciatica, right side: Secondary | ICD-10-CM | POA: Diagnosis not present

## 2018-05-03 DIAGNOSIS — M25561 Pain in right knee: Secondary | ICD-10-CM | POA: Diagnosis not present

## 2018-05-08 DIAGNOSIS — M545 Low back pain: Secondary | ICD-10-CM | POA: Diagnosis not present

## 2018-05-08 DIAGNOSIS — M25561 Pain in right knee: Secondary | ICD-10-CM | POA: Diagnosis not present

## 2018-05-10 DIAGNOSIS — M1711 Unilateral primary osteoarthritis, right knee: Secondary | ICD-10-CM | POA: Diagnosis not present

## 2018-06-09 ENCOUNTER — Other Ambulatory Visit: Payer: Self-pay

## 2018-06-09 DIAGNOSIS — I83811 Varicose veins of right lower extremities with pain: Secondary | ICD-10-CM

## 2018-06-23 ENCOUNTER — Ambulatory Visit (HOSPITAL_COMMUNITY)
Admission: RE | Admit: 2018-06-23 | Discharge: 2018-06-23 | Disposition: A | Discharge status: Home / Self Care | Payer: BLUE CROSS/BLUE SHIELD | Source: Ambulatory Visit | Attending: Family | Admitting: Family

## 2018-06-23 DIAGNOSIS — I83811 Varicose veins of right lower extremities with pain: Secondary | ICD-10-CM | POA: Insufficient documentation

## 2018-06-26 ENCOUNTER — Other Ambulatory Visit: Payer: Self-pay

## 2018-06-26 ENCOUNTER — Ambulatory Visit (INDEPENDENT_AMBULATORY_CARE_PROVIDER_SITE_OTHER): Payer: BLUE CROSS/BLUE SHIELD | Admitting: Surgery

## 2018-06-26 ENCOUNTER — Encounter: Payer: Self-pay | Admitting: Surgery

## 2018-06-26 VITALS — BP 150/89 | HR 64 | Temp 97.0°F | Resp 18 | Ht 67.0 in | Wt 225.0 lb

## 2018-06-26 DIAGNOSIS — I8392 Asymptomatic varicose veins of left lower extremity: Secondary | ICD-10-CM | POA: Diagnosis not present

## 2018-06-26 NOTE — Progress Notes (Signed)
[facility]  Patient name: Briana Nielsen MRN: [id number] DOB: 1971/10/28 Sex: female   REQUESTING PROVIDER:    self   REASON FOR CONSULT:    veins  HISTORY OF PRESENT ILLNESS:   Briana Nielsen is a 47 y.o. female, who is here today due to concerns of her veins around the inside part of her right knee.  Recently, she had a episode where this area became hard and painful.  It has improved.  She was also seen by orthopedics at the time because she was having knee pain and was found to have meniscus injury and is now scheduled for partial right knee replacement.  She is concerned that she may also have a venous issue.  She had a venous ultrasound of the hospital last week which was negative for DVT.  She denies any significant swelling in the leg  PAST MEDICAL HISTORY    Past Medical History:  Diagnosis Date  . Arthritis    right knee  . Herpes zoster   . Menometrorrhagia   . Ovarian cyst, left      FAMILY HISTORY   Family History  Problem Relation Age of Onset  . Cancer Mother        anal  . Dermatomyositis Mother   . Mental illness Sister   . Drug abuse Sister   . Breast cancer Maternal Grandmother 60  . Pancreatic cancer Maternal Aunt     SOCIAL HISTORY:   Social History   Socioeconomic History  . Marital status: Married    Spouse name: Not on file  . Number of children: Not on file  . Years of education: Not on file  . Highest education level: Not on file  Occupational History  . Not on file  Social Needs  . Financial resource strain: Not on file  . Food insecurity:    Worry: Not on file    Inability: Not on file  . Transportation needs:    Medical: Not on file    Non-medical: Not on file  Tobacco Use  . Smoking status: Former Smoker    Types: E-cigarettes    Last attempt to quit: 02/22/2012    Years since quitting: 6.3  . Smokeless tobacco: Never Used  Substance and Sexual Activity  .  Alcohol use: No    Frequency: Never  . Drug use: Yes    Types: Marijuana    Comment: every now & then  . Sexual activity: Yes  Lifestyle  . Physical activity:    Days per week: Not on file    Minutes per session: Not on file  . Stress: Not on file  Relationships  . Social connections:    Talks on phone: Not on file    Gets together: Not on file    Attends religious service: Not on file    Active member of club or organization: Not on file    Attends meetings of clubs or organizations: Not on file    Relationship status: Not on file  . Intimate partner violence:    Fear of current or ex partner: Not on file    Emotionally abused: Not on file    Physically abused: Not on file    Forced sexual activity: Not on file  Other Topics Concern  . Not on file  Social History Narrative  . Not on file    ALLERGIES:    No Known Allergies  CURRENT MEDICATIONS:    Current Outpatient Medications  Medication Sig Dispense Refill  . ibuprofen (ADVIL,MOTRIN) 800 MG tablet Take 800 mg by mouth every 8 (eight) hours as needed.     No current facility-administered medications for this visit.     REVIEW OF SYSTEMS:   [X]  denotes positive finding, [ ]  denotes negative finding Cardiac  Comments:  Chest pain or chest pressure:    Shortness of breath upon exertion:    Short of breath when lying flat:    Irregular heart rhythm:        Vascular    Pain in calf, thigh, or hip brought on by ambulation:    Pain in feet at night that wakes you up from your sleep:     Blood clot in your veins:    Leg swelling:         Pulmonary    Oxygen at home:    Productive cough:     Wheezing:         Neurologic    Sudden weakness in arms or legs:     Sudden numbness in arms or legs:     Sudden onset of difficulty speaking or slurred speech:    Temporary loss of vision in one eye:     Problems with dizziness:         Gastrointestinal    Blood in stool:      Vomited blood:           Genitourinary    Burning when urinating:     Blood in urine:        Psychiatric    Major depression:         Hematologic    Bleeding problems:    Problems with blood clotting too easily:        Skin    Rashes or ulcers:        Constitutional    Fever or chills:     PHYSICAL EXAM:   Vitals:   06/26/18 1423  BP: (!) 150/89  Pulse: 64  Resp: 18  Temp: (!) 97 F (36.1 C)  TempSrc: Oral  SpO2: 100%  Weight: 225 lb (102.1 kg)  Height: 5\' 7"  (1.702 m)    GENERAL: The patient is a well-nourished female, in no acute distress. The vital signs are documented above. CARDIAC: There is a regular rate and rhythm.  VASCULAR: No edema in the right leg.  Spider vein is present around the medial right knee.  No firmness is elicited, however it is somewhat tender to touch.  There is no erythema. PULMONARY: Nonlabored respirations MUSCULOSKELETAL: There are no major deformities or cyanosis. NEUROLOGIC: No focal weakness or paresthesias are detected. SKIN: There are no ulcers or rashes noted. PSYCHIATRIC: The patient has a normal affect.  STUDIES:   I have reviewed her ultrasound from the hospital which was negative for DVT  ASSESSMENT and PLAN   Right leg spider vein: I told the patient that I suspect she had an episode of thrombophlebitis several weeks ago which was responsible for symptoms involving the vein.  This appears to to have resolved.  I would not recommend any intervention on this area at this time.  Rather I think she needs to go ahead and have her knee replaced.  Once she has fully recovered from this if she is still having issues with this area, we could consider sclerotherapy.  The patient will contact me if she needs to have that addressed.   Durene Cal, MD [facility]  Tel [phone] Pager [phone]

## 2018-06-28 NOTE — Progress Notes (Signed)
In Care Everywhere  

## 2018-07-27 NOTE — Progress Notes (Signed)
Please place orders in epic pt. Has a preop scheduled !Thank You! 

## 2018-07-28 ENCOUNTER — Other Ambulatory Visit: Payer: Self-pay | Admitting: Orthopedic Surgery

## 2018-08-01 ENCOUNTER — Inpatient Hospital Stay (HOSPITAL_COMMUNITY)
Admission: RE | Admit: 2018-08-01 | Discharge: 2018-08-01 | Disposition: A | Discharge status: Home / Self Care | Payer: BLUE CROSS/BLUE SHIELD | Source: Ambulatory Visit

## 2018-08-08 ENCOUNTER — Ambulatory Visit: Admit: 2018-08-08 | Payer: BLUE CROSS/BLUE SHIELD | Admitting: Orthopedic Surgery

## 2018-08-08 SURGERY — ARTHROPLASTY, KNEE, UNICOMPARTMENTAL
Anesthesia: Choice | Site: Knee | Laterality: Right

## 2018-08-10 ENCOUNTER — Ambulatory Visit: Payer: BLUE CROSS/BLUE SHIELD | Admitting: Family Medicine

## 2019-09-20 ENCOUNTER — Other Ambulatory Visit: Payer: Self-pay

## 2019-09-21 ENCOUNTER — Ambulatory Visit (INDEPENDENT_AMBULATORY_CARE_PROVIDER_SITE_OTHER): Payer: BC Managed Care – PPO | Admitting: Family Medicine

## 2019-09-21 ENCOUNTER — Encounter: Payer: Self-pay | Admitting: Family Medicine

## 2019-09-21 VITALS — BP 138/86 | HR 67 | Temp 98.6°F | Wt 214.0 lb

## 2019-09-21 DIAGNOSIS — Z01419 Encounter for gynecological examination (general) (routine) without abnormal findings: Secondary | ICD-10-CM | POA: Diagnosis not present

## 2019-09-21 DIAGNOSIS — Z124 Encounter for screening for malignant neoplasm of cervix: Secondary | ICD-10-CM

## 2019-09-21 DIAGNOSIS — Z01411 Encounter for gynecological examination (general) (routine) with abnormal findings: Secondary | ICD-10-CM

## 2019-09-21 DIAGNOSIS — E669 Obesity, unspecified: Secondary | ICD-10-CM | POA: Diagnosis not present

## 2019-09-21 DIAGNOSIS — Z1211 Encounter for screening for malignant neoplasm of colon: Secondary | ICD-10-CM

## 2019-09-21 DIAGNOSIS — Z780 Asymptomatic menopausal state: Secondary | ICD-10-CM | POA: Diagnosis not present

## 2019-09-21 DIAGNOSIS — R002 Palpitations: Secondary | ICD-10-CM | POA: Diagnosis not present

## 2019-09-21 DIAGNOSIS — R110 Nausea: Secondary | ICD-10-CM | POA: Diagnosis not present

## 2019-09-21 LAB — BAYER DCA HB A1C WAIVED: HB A1C (BAYER DCA - WAIVED): 5.6 % (ref <7.0)

## 2019-09-21 NOTE — Patient Instructions (Addendum)
You had labs performed today.  You will be contacted with the results of the labs once they are available, usually in the next 3 business days for routine lab work.  If you have an active my chart account, they will be released to your MyChart.  If you prefer to have these labs released to you via telephone, please let us know.  If you had a pap smear or biopsy performed, expect to be contacted in about 7-10 days.   Palpitations Palpitations are feelings that your heartbeat is irregular or is faster than normal. It may feel like your heart is fluttering or skipping a beat. Palpitations are usually not a serious problem. They may be caused by many things, including smoking, caffeine, alcohol, stress, and certain medicines or drugs. Most causes of palpitations are not serious. However, some palpitations can be a sign of a serious problem. You may need further tests to rule out serious medical problems. Follow these instructions at home:     Pay attention to any changes in your condition. Take these actions to help manage your symptoms: Eating and drinking  Avoid foods and drinks that may cause palpitations. These may include: ? Caffeinated coffee, tea, soft drinks, diet pills, and energy drinks. ? Chocolate. ? Alcohol. Lifestyle  Take steps to reduce your stress and anxiety. Things that can help you relax include: ? Yoga. ? Mind-body activities, such as deep breathing, meditation, or using words and images to create positive thoughts (guided imagery). ? Physical activity, such as swimming, jogging, or walking. Tell your health care provider if your palpitations increase with activity. If you have chest pain or shortness of breath with activity, do not continue the activity until you are seen by your health care provider. ? Biofeedback. This is a method that helps you learn to use your mind to control things in your body, such as your heartbeat.  Do not use drugs, including cocaine or ecstasy.  Do not use marijuana.  Get plenty of rest and sleep. Keep a regular bed time. General instructions  Take over-the-counter and prescription medicines only as told by your health care provider.  Do not use any products that contain nicotine or tobacco, such as cigarettes and e-cigarettes. If you need help quitting, ask your health care provider.  Keep all follow-up visits as told by your health care provider. This is important. These may include visits for further testing if palpitations do not go away or get worse. Contact a health care provider if you:  Continue to have a fast or irregular heartbeat after 24 hours.  Notice that your palpitations occur more often. Get help right away if you:  Have chest pain or shortness of breath.  Have a severe headache.  Feel dizzy or you faint. Summary  Palpitations are feelings that your heartbeat is irregular or is faster than normal. It may feel like your heart is fluttering or skipping a beat.  Palpitations may be caused by many things, including smoking, caffeine, alcohol, stress, certain medicines, and drugs.  Although most causes of palpitations are not serious, some causes can be a sign of a serious medical problem.  Get help right away if you faint or have chest pain, shortness of breath, a severe headache, or dizziness. This information is not intended to replace advice given to you by your health care provider. Make sure you discuss any questions you have with your health care provider. Document Released: 11/05/2000 Document Revised: 12/21/2017 Document Reviewed: 12/21/2017 Elsevier Patient  Education  2020 [employer]   Preventive Care 32-10 Years Old, Female Preventive care refers to visits with your health care provider and lifestyle choices that can promote health and wellness. This includes:  A yearly physical exam. This may also be called an annual well check.  Regular dental visits and eye  exams.  Immunizations.  Screening for certain conditions.  Healthy lifestyle choices, such as eating a healthy diet, getting regular exercise, not using drugs or products that contain nicotine and tobacco, and limiting alcohol use. What can I expect for my preventive care visit? Physical exam Your health care provider will check your:  Height and weight. This may be used to calculate body mass index (BMI), which tells if you are at a healthy weight.  Heart rate and blood pressure.  Skin for abnormal spots. Counseling Your health care provider may ask you questions about your:  Alcohol, tobacco, and drug use.  Emotional well-being.  Home and relationship well-being.  Sexual activity.  Eating habits.  Work and work Statistician.  Method of birth control.  Menstrual cycle.  Pregnancy history. What immunizations do I need?  Influenza (flu) vaccine  This is recommended every year. Tetanus, diphtheria, and pertussis (Tdap) vaccine  You may need a Td booster every 10 years. Varicella (chickenpox) vaccine  You may need this if you have not been vaccinated. Zoster (shingles) vaccine  You may need this after age 41. Measles, mumps, and rubella (MMR) vaccine  You may need at least one dose of MMR if you were born in 1957 or later. You may also need a second dose. Pneumococcal conjugate (PCV13) vaccine  You may need this if you have certain conditions and were not previously vaccinated. Pneumococcal polysaccharide (PPSV23) vaccine  You may need one or two doses if you smoke cigarettes or if you have certain conditions. Meningococcal conjugate (MenACWY) vaccine  You may need this if you have certain conditions. Hepatitis A vaccine  You may need this if you have certain conditions or if you travel or work in places where you may be exposed to hepatitis A. Hepatitis B vaccine  You may need this if you have certain conditions or if you travel or work in places where  you may be exposed to hepatitis B. Haemophilus influenzae type b (Hib) vaccine  You may need this if you have certain conditions. Human papillomavirus (HPV) vaccine  If recommended by your health care provider, you may need three doses over 6 months. You may receive vaccines as individual doses or as more than one vaccine together in one shot (combination vaccines). Talk with your health care provider about the risks and benefits of combination vaccines. What tests do I need? Blood tests  Lipid and cholesterol levels. These may be checked every 5 years, or more frequently if you are over 65 years old.  Hepatitis C test.  Hepatitis B test. Screening  Lung cancer screening. You may have this screening every year starting at age 70 if you have a 30-pack-year history of smoking and currently smoke or have quit within the past 15 years.  Colorectal cancer screening. All adults should have this screening starting at age 72 and continuing until age 96. Your health care provider may recommend screening at age 60 if you are at increased risk. You will have tests every 1-10 years, depending on your results and the type of screening test.  Diabetes screening. This is done by checking your blood sugar (glucose) after you have not eaten  for a while (fasting). You may have this done every 1-3 years.  Mammogram. This may be done every 1-2 years. Talk with your health care provider about when you should start having regular mammograms. This may depend on whether you have a family history of breast cancer.  BRCA-related cancer screening. This may be done if you have a family history of breast, ovarian, tubal, or peritoneal cancers.  Pelvic exam and Pap test. This may be done every 3 years starting at age 43. Starting at age 66, this may be done every 5 years if you have a Pap test in combination with an HPV test. Other tests  Sexually transmitted disease (STD) testing.  Bone density scan. This is  done to screen for osteoporosis. You may have this scan if you are at high risk for osteoporosis. Follow these instructions at home: Eating and drinking  Eat a diet that includes fresh fruits and vegetables, whole grains, lean protein, and low-fat dairy.  Take vitamin and mineral supplements as recommended by your health care provider.  Do not drink alcohol if: ? Your health care provider tells you not to drink. ? You are pregnant, may be pregnant, or are planning to become pregnant.  If you drink alcohol: ? Limit how much you have to 0-1 drink a day. ? Be aware of how much alcohol is in your drink. In the [place] one drink equals one 12 oz bottle of beer (355 mL), one 5 oz glass of wine (148 mL), or one 1 oz glass of hard liquor (44 mL). Lifestyle  Take daily care of your teeth and gums.  Stay active. Exercise for at least 30 minutes on 5 or more days each week.  Do not use any products that contain nicotine or tobacco, such as cigarettes, e-cigarettes, and chewing tobacco. If you need help quitting, ask your health care provider.  If you are sexually active, practice safe sex. Use a condom or other form of birth control (contraception) in order to prevent pregnancy and STIs (sexually transmitted infections).  If told by your health care provider, take low-dose aspirin daily starting at age 65. What's next?  Visit your health care provider once a year for a well check visit.  Ask your health care provider how often you should have your eyes and teeth checked.  Stay up to date on all vaccines. This information is not intended to replace advice given to you by your health care provider. Make sure you discuss any questions you have with your health care provider. Document Released: 12/05/2015 Document Revised: 07/20/2018 Document Reviewed: 07/20/2018 Elsevier Patient Education  2020 [employer].

## 2019-09-21 NOTE — Progress Notes (Signed)
Briana Nielsen is a 47 y.o. female presents to office today for annual physical exam examination.    Concerns today include: 1.  Heart palpitations Patient reports about a 1 year history of intermittent heart palpitations that seem to be becoming more frequent in nature.  She describes heart rapidly beating with sensation of her throat closing up.  She does not have overt shortness of breath or chest pain during these episodes but notes that she is worried because of becoming more frequent and are not associated with anxiety.  She does admit to starting a higher stress job in the last year but again does not feel anxious during episodes.  Her family history is significant for heart failure in her mother, who passed away from complications of heart failure and heart disease in her maternal aunt but she is not sure what type of heart disease.  No known history of arrhythmia.  She has not had a period in greater than 2 years and is essentially postmenopausal now.  No blood loss from any orifice.  No known thyroid disease.  She does drink about 3 to 4 cups of coffee per day.  Occupation: Works a Designer, multimedia, Marital status: Married, Substance use: None Diet: Typical American with caffeine as above, Exercise: No structured Last colonoscopy: Never.  Would like to establish in [place] as this is where she works Last mammogram: Needs. Last pap smear: Needs Refills needed today: N/A Immunizations needed: Flu Vaccine declined  Past Medical History:  Diagnosis Date  . Arthritis    right knee  . Herpes zoster   . Menometrorrhagia   . Ovarian cyst, left    Social History   Socioeconomic History  . Marital status: Married    Spouse name: Not on file  . Number of children: Not on file  . Years of education: Not on file  . Highest education level: Not on file  Occupational History  . Not on file  Social Needs  . Financial resource strain: Not on file  . Food insecurity    Worry: Not on file   Inability: Not on file  . Transportation needs    Medical: Not on file    Non-medical: Not on file  Tobacco Use  . Smoking status: Former Smoker    Types: E-cigarettes    Quit date: 02/22/2012    Years since quitting: 7.5  . Smokeless tobacco: Never Used  Substance and Sexual Activity  . Alcohol use: No    Frequency: Never  . Drug use: Yes    Types: Marijuana    Comment: every now & then  . Sexual activity: Yes    Birth control/protection: Post-menopausal  Lifestyle  . Physical activity    Days per week: Not on file    Minutes per session: Not on file  . Stress: Not on file  Relationships  . Social Herbalist on phone: Not on file    Gets together: Not on file    Attends religious service: Not on file    Active member of club or organization: Not on file    Attends meetings of clubs or organizations: Not on file    Relationship status: Not on file  . Intimate partner violence    Fear of current or ex partner: Not on file    Emotionally abused: Not on file    Physically abused: Not on file    Forced sexual activity: Not on file  Other Topics Concern  .  Not on file  Social History Narrative  . Not on file   Past Surgical History:  Procedure Laterality Date  . APPENDECTOMY    . CESAREAN SECTION    . ENDOMETRIAL BIOPSY  03/29/2016   negative  . LAPAROSCOPIC OOPHERECTOMY  2000  . TUBAL LIGATION     per OB/GYn report   Family History  Problem Relation Age of Onset  . Cancer Mother        anal  . Dermatomyositis Mother   . Mental illness Sister   . Drug abuse Sister   . Breast cancer Maternal Grandmother 60  . Pancreatic cancer Maternal Aunt    No current outpatient medications on file.  No Known Allergies   ROS: Review of Systems Constitutional: negative Eyes: negative Ears, nose, mouth, throat, and face: positive for Decreased hearing, this is chronic Respiratory: negative Cardiovascular: positive for palpitations Gastrointestinal: positive  for nausea Genitourinary:negative Integument/breast: negative Hematologic/lymphatic: negative Musculoskeletal:positive for Knee pain that is now under better control with supplementation.  She is holding off on surgery Neurological: negative Behavioral/Psych: negative Endocrine: negative Allergic/Immunologic: negative    Physical exam There were no vitals taken for this visit. General appearance: alert, cooperative, appears stated age and no distress Head: Normocephalic, without obvious abnormality, atraumatic Eyes: negative findings: lids and lashes normal, conjunctivae and sclerae normal, corneas clear and pupils equal, round, reactive to light and accomodation Ears: normal TM's and external ear canals both ears Nose: Nares normal. Septum midline. Mucosa normal. No drainage or sinus tenderness. Throat: lips, mucosa, and tongue normal; teeth and gums normal and Caps noted posteriorly Neck: no adenopathy, supple, symmetrical, trachea midline and thyroid not enlarged, symmetric, no tenderness/mass/nodules Back: symmetric, no curvature. ROM normal. No CVA tenderness. Lungs: clear to auscultation bilaterally Heart: regular rate and rhythm, S1, S2 normal, no murmur, click, rub or gallop Abdomen: soft, non-tender; bowel sounds normal; no masses,  no organomegaly Pelvic: cervix normal in appearance, external genitalia normal, no adnexal masses or tenderness, no cervical motion tenderness, rectovaginal septum normal, uterus normal size, shape, and consistency and vagina normal without discharge Extremities: extremities normal, atraumatic, no cyanosis or edema Pulses: 2+ and symmetric Skin: Skin color, texture, turgor normal. No rashes or lesions Lymph nodes: Cervical, supraclavicular, and axillary nodes normal. Neurologic: Alert and oriented X 3, normal strength and tone. Normal symmetric reflexes. Normal coordination and gait Psych: Mood stable, speech normal, affect appropriate, pleasant and  interactive Depression screen [facility] 2/9 09/21/2019 02/21/2018  Decreased Interest 0 0  Down, Depressed, Hopeless 0 2  PHQ - 2 Score 0 2  Altered sleeping 0 0  Tired, decreased energy 0 2  Change in appetite 0 0  Feeling bad or failure about yourself  0 1  Trouble concentrating 0 0  Moving slowly or fidgety/restless 0 0  Suicidal thoughts 0 0  PHQ-9 Score 0 5      Assessment/ Plan: Eliana Lueth here for annual physical exam.   1. Well woman exam with routine gynecological exam - Pap IG and HPV (high risk) DNA detection  2. Screening for malignant neoplasm of cervix History of fibroid tumor requiring excision previously.  She is not having any pelvic concerns today - Pap IG and HPV (high risk) DNA detection  3. Postmenopausal Without menses in greater than 2 years - Pap IG and HPV (high risk) DNA detection  4. Obesity (BMI 35.0-39.9 without comorbidity) - Lipid Panel - CMP14+EGFR - TSH - Bayer DCA Hb A1c Waived  5. Heart palpitations We  discussed possibility of PVCs versus atrial fibrillation versus other arrhythmia.  We will check metabolic labs and refer to cardiology for consideration of Holter monitor.  I advised her to restrict caffeine.  Handout was provided. - TSH - Ambulatory referral to Cardiology - Magnesium - CBC  6. Screen for colon cancer No known family history or personal history of colon cancers.  Her mother did have an anal cancer previously.  Referral to gastroenterology placed. - Ambulatory referral to Gastroenterology  7. Nausea without vomiting Question GERD mediated.  Given family history of stomach cancer and anal cancers I have referred her to gastro for further evaluation.  Consider trial of PPI. - Ambulatory referral to Gastroenterology   Handout provided on healthy lifestyle choices, including diet (rich in fruits, vegetables and lean meats and low in salt and simple carbohydrates) and exercise (at least 30 minutes of moderate physical  activity daily).  Patient to follow up in 1 year for annual exam or sooner if needed.   M. Lajuana Ripple, DO

## 2019-09-22 LAB — CMP14+EGFR
ALT: 16 IU/L (ref 0–32)
AST: 14 IU/L (ref 0–40)
Albumin/Globulin Ratio: 1.9 (ref 1.2–2.2)
Albumin: 4.3 g/dL (ref 3.8–4.8)
Alkaline Phosphatase: 80 IU/L (ref 39–117)
BUN/Creatinine Ratio: 20 (ref 9–23)
BUN: 16 mg/dL (ref 6–24)
Bilirubin Total: 0.3 mg/dL (ref 0.0–1.2)
CO2: 24 mmol/L (ref 20–29)
Calcium: 9.7 mg/dL (ref 8.7–10.2)
Chloride: 103 mmol/L (ref 96–106)
Creatinine, Ser: 0.82 mg/dL (ref 0.57–1.00)
GFR calc Af Amer: 96 mL/min/{1.73_m2} (ref >59)
GFR calc non Af Amer: 84 mL/min/{1.73_m2} (ref >59)
Globulin, Total: 2.3 g/dL (ref 1.5–4.5)
Glucose: 85 mg/dL (ref 65–99)
Potassium: 4.3 mmol/L (ref 3.5–5.2)
Sodium: 141 mmol/L (ref 134–144)
Total Protein: 6.6 g/dL (ref 6.0–8.5)

## 2019-09-22 LAB — TSH: TSH: 2.1 u[IU]/mL (ref 0.450–4.500)

## 2019-09-22 LAB — CBC
Hematocrit: 38.5 % (ref 34.0–46.6)
Hemoglobin: 13.1 g/dL (ref 11.1–15.9)
MCH: 30.9 pg (ref 26.6–33.0)
MCHC: 34 g/dL (ref 31.5–35.7)
MCV: 91 fL (ref 79–97)
Platelets: 278 10*3/uL (ref 150–450)
RBC: 4.24 x10E6/uL (ref 3.77–5.28)
RDW: 13.1 % (ref 11.7–15.4)
WBC: 6.6 10*3/uL (ref 3.4–10.8)

## 2019-09-22 LAB — LIPID PANEL
Chol/HDL Ratio: 2.5 ratio (ref 0.0–4.4)
Cholesterol, Total: 155 mg/dL (ref 100–199)
HDL: 61 mg/dL (ref >39)
LDL Chol Calc (NIH): 81 mg/dL (ref 0–99)
Triglycerides: 63 mg/dL (ref 0–149)
VLDL Cholesterol Cal: 13 mg/dL (ref 5–40)

## 2019-09-22 LAB — MAGNESIUM: Magnesium: 1.9 mg/dL (ref 1.6–2.3)

## 2019-09-28 ENCOUNTER — Telehealth: Payer: Self-pay | Admitting: Family Medicine

## 2019-09-28 NOTE — Telephone Encounter (Signed)
Patient is asking for labs and pap from 10/30. Please review.

## 2019-09-28 NOTE — Telephone Encounter (Signed)
Labs were released to mychart.  Normal.  Pap has not come back yet I will ask lab to check on that.

## 2019-09-28 NOTE — Telephone Encounter (Signed)
Patient aware and verbalized understanding. °

## 2019-09-29 LAB — PAP IG AND HPV HIGH-RISK: HPV, high-risk: NEGATIVE

## 2019-10-02 DIAGNOSIS — Z1231 Encounter for screening mammogram for malignant neoplasm of breast: Secondary | ICD-10-CM | POA: Diagnosis not present

## 2019-10-05 DIAGNOSIS — Z20828 Contact with and (suspected) exposure to other viral communicable diseases: Secondary | ICD-10-CM | POA: Diagnosis not present

## 2019-12-11 DIAGNOSIS — R002 Palpitations: Secondary | ICD-10-CM | POA: Insufficient documentation

## 2019-12-11 DIAGNOSIS — Z7189 Other specified counseling: Secondary | ICD-10-CM | POA: Insufficient documentation

## 2019-12-11 NOTE — Progress Notes (Signed)
Cardiology Office Note   Date:  12/13/2019   ID:  Briana Nielsen, DOB 1971/04/13, MRN [id number]  PCP:  Raliegh Ip, DO  Cardiologist:   No primary care provider on file. Referring:  Raliegh Ip, DO  No chief complaint on file.     History of Present Illness: Briana Nielsen is a 49 y.o. female who is referred by Raliegh Ip, DO for evaluation of palpitations.  She had no prior cardiac history.  She said about a year ago she had her heart racing.  She felt a swelling in her throat.  She felt blood rushing to her head.  And went away after several minutes.  She was getting it frequently and was told maybe she was having panic attacks but she does not think she is a very anxious person.  However, she did get some news about a relative who was sick not long ago when she had the same symptoms and wondered maybe whether it could be anxiety.  However, the does not usually seem to be a trigger.  She was getting it more frequently but is now down about 1 time to 2 times per week.  It last for several minutes.  She has to lie down.  It will go away on its ow it seems to be sudden in onset.  She does not have chest pressure, neck or arm discomfort.  She has had no new shortness of breath, PND or orthopnea.  She is not had any weight gain or edema.  She does drink some caffeine but does not associate this.    Past Medical History:  Diagnosis Date  . Arthritis    right knee  . Herpes zoster   . Menometrorrhagia   . Ovarian cyst, left     Past Surgical History:  Procedure Laterality Date  . APPENDECTOMY    . CESAREAN SECTION    . ENDOMETRIAL BIOPSY  03/29/2016   negative  . LAPAROSCOPIC OOPHERECTOMY  2000  . TUBAL LIGATION     per OB/GYn report     No current outpatient medications on file.   No current facility-administered medications for this visit.    Allergies:   Patient has no known allergies.    Social History:  The patient  reports that she quit  smoking about 7 years ago. Her smoking use included e-cigarettes. She has never used smokeless tobacco. She reports current drug use. Drug: Marijuana. She reports that she does not drink alcohol.   Family History:  The patient's family history includes Breast cancer (age of onset: 88) in her maternal grandmother; Cancer in her mother; Dermatomyositis in her mother; Drug abuse in her sister; Heart attack in her father; Mental illness in her sister; Pancreatic cancer in her maternal aunt.    ROS:  Please see the history of present illness.   Otherwise, review of systems are positive for none.   All other systems are reviewed and negative.    PHYSICAL EXAM: VS:  BP 138/90   Pulse 65   Ht 5\' 8"  (1.727 m)   Wt 204 lb (92.5 kg)   LMP 06/23/2017   BMI 31.02 kg/m  , BMI Body mass index is 31.02 kg/m. GENERAL:  Well appearing HEENT:  Pupils equal round and reactive, fundi not visualized, oral mucosa unremarkable NECK:  No jugular venous distention, waveform within normal limits, carotid upstroke brisk and symmetric, no bruits, no thyromegaly LYMPHATICS:  No cervical, inguinal adenopathy LUNGS:  Clear to  auscultation bilaterally BACK:  No CVA tenderness CHEST:  Unremarkable HEART:  PMI not displaced or sustained,S1 and S2 within normal limits, no S3, no S4, no clicks, no rubs, no murmurs ABD:  Flat, positive bowel sounds normal in frequency in pitch, no bruits, no rebound, no guarding, no midline pulsatile mass, no hepatomegaly, no splenomegaly EXT:  2 plus pulses throughout, no edema, no cyanosis no clubbing SKIN:  No rashes no nodules NEURO:  Cranial nerves II through XII grossly intact, motor grossly intact throughout PSYCH:  Cognitively intact, oriented to person place and time    EKG:  EKG is ordered today. The ekg ordered today demonstrates sinus rhythm, rate 65, axis within normal limits, intervals within normal limits, no acute ST-T wave changes.   Recent Labs: 09/21/2019: ALT 16;  BUN 16; Creatinine, Ser 0.82; Hemoglobin 13.1; Magnesium 1.9; Platelets 278; Potassium 4.3; Sodium 141; TSH 2.100    Lipid Panel    Component Value Date/Time   CHOL 155 09/21/2019 1207   TRIG 63 09/21/2019 1207   HDL 61 09/21/2019 1207   CHOLHDL 2.5 09/21/2019 1207   LDLCALC 81 09/21/2019 1207      Wt Readings from Last 3 Encounters:  12/12/19 204 lb (92.5 kg)  09/21/19 214 lb (97.1 kg)  06/26/18 225 lb (102.1 kg)      Other studies Reviewed: Additional studies/ records that were reviewed today include: Labs. Review of the above records demonstrates:  Please see elsewhere in the note.     ASSESSMENT AND PLAN:  PALPITATIONS:   The patient has tachypalpitations it could be an automatic arrhythmia.  She is going to get Alive Cor.  At this point she would prefer conservative management and a beta-blocker only if she has increased symptoms.  Labs have been okay to include her thyroid.  She does drink caffeine and we talked about this and she might consider holding off on this if she finds an association.  She will let me know if if she has any dysrhythmias that are recorded and we will discuss further management.  COVID EDUCATION: We spent a long time talking about the vaccine and she is very hesitant.  I answered all her questions.    Current medicines are reviewed at length with the patient today.  The patient does not have concerns regarding medicines.  The following changes have been made:  no change  Labs/ tests ordered today include: None  Orders Placed This Encounter  Procedures  . EKG 12-Lead     Disposition:   FU with as needed.      Signed, Minus Breeding, MD  12/13/2019 12:55 PM    [place]

## 2019-12-12 ENCOUNTER — Other Ambulatory Visit: Payer: Self-pay

## 2019-12-12 ENCOUNTER — Encounter: Payer: Self-pay | Admitting: Cardiology

## 2019-12-12 ENCOUNTER — Ambulatory Visit (INDEPENDENT_AMBULATORY_CARE_PROVIDER_SITE_OTHER): Payer: BC Managed Care – PPO | Admitting: Cardiology

## 2019-12-12 VITALS — BP 138/90 | HR 65 | Ht 68.0 in | Wt 204.0 lb

## 2019-12-12 DIAGNOSIS — Z7189 Other specified counseling: Secondary | ICD-10-CM | POA: Diagnosis not present

## 2019-12-12 DIAGNOSIS — R002 Palpitations: Secondary | ICD-10-CM | POA: Diagnosis not present

## 2019-12-12 NOTE — Patient Instructions (Signed)
Medication Instructions:  The current medical regimen is effective;  continue present plan and medications.  *If you need a refill on your cardiac medications before your next appointment, please call your pharmacy*  Follow-Up: Follow up as needed with Dr Antoine Poche.  Thank you for choosing [facility]!!     Alivecor

## 2019-12-13 ENCOUNTER — Encounter: Payer: Self-pay | Admitting: Cardiology

## 2020-01-09 ENCOUNTER — Other Ambulatory Visit: Payer: Self-pay

## 2020-01-11 ENCOUNTER — Other Ambulatory Visit: Payer: Self-pay

## 2020-01-11 ENCOUNTER — Ambulatory Visit (INDEPENDENT_AMBULATORY_CARE_PROVIDER_SITE_OTHER): Payer: BC Managed Care – PPO | Admitting: Family Medicine

## 2020-01-11 DIAGNOSIS — M79609 Pain in unspecified limb: Secondary | ICD-10-CM

## 2020-01-11 DIAGNOSIS — L989 Disorder of the skin and subcutaneous tissue, unspecified: Secondary | ICD-10-CM

## 2020-01-11 DIAGNOSIS — R531 Weakness: Secondary | ICD-10-CM

## 2020-01-11 DIAGNOSIS — R202 Paresthesia of skin: Secondary | ICD-10-CM

## 2020-01-11 NOTE — Progress Notes (Signed)
Assessment & Plan:  1. Cervical radiculopathy - Education provided on cervical radiculopathy and exercises she can do. Patient declined steroids and would just like to try NSAIDs. Also declined x-ray at this time.  - diclofenac (VOLTAREN) 75 MG EC tablet; Take 1 tablet (75 mg total) by mouth 2 (two) times daily.  Dispense: 40 tablet; Refill: 0  2. Cyst of soft tissue - Reassurance provided.    Follow up plan: Return in about 4 weeks (around 02/12/2020) for f/u cervical radiculopathy.  Deliah Boston, MSN, APRN, FNP-C [facility]  Subjective:   Patient ID: Briana Nielsen, female    DOB: 26-Dec-1970, 49 y.o.   MRN: [id number]  HPI: Briana Nielsen is a 49 y.o. female presenting on 01/15/2020 for Numbness (Left arm - x 1/2 month ago and has gotten worse) and Mass (under right arm. been there for years and was told it was a polyp.  Started finding them on left arm as well)  Patient reports she has been experiencing intermittent numbness in her left arm that started a couple of weeks ago.  The numbness does occur daily.  She is also been experiencing left shoulder pain for the past 3 weeks and neck pain for the past week.  The left arm also feels weak and tingly at times.  She had a 90-minute massage therapy session yesterday which she felt gave her a little relief but then her symptoms came right back.  Patient is also concerned that she has not knots on bilateral upper extremities that she is worried about.  The first one that she noticed is on her right arm and she states she was told it was benign several years ago.  Since she has been having the symptoms with her left arm she has found multiple spots on her upper left arm.   ROS: Negative unless specifically indicated above in HPI.   Relevant past medical history reviewed and updated as indicated.   Allergies and medications reviewed and updated.   Current Outpatient Medications:  .  diclofenac (VOLTAREN) 75 MG EC  tablet, Take 1 tablet (75 mg total) by mouth 2 (two) times daily., Disp: 40 tablet, Rfl: 0  No Known Allergies  Objective:   BP 121/73   Pulse 77   Temp 99.1 F (37.3 C) (Temporal)   Ht 5\' 8"  (1.727 m)   Wt 204 lb (92.5 kg)   LMP 06/23/2017   SpO2 96%   BMI 31.02 kg/m    Physical Exam Vitals reviewed.  Constitutional:      General: She is not in acute distress.    Appearance: Normal appearance. She is obese. She is not ill-appearing, toxic-appearing or diaphoretic.  HENT:     Head: Normocephalic and atraumatic.  Eyes:     General: No scleral icterus.       Right eye: No discharge.        Left eye: No discharge.     Conjunctiva/sclera: Conjunctivae normal.  Cardiovascular:     Rate and Rhythm: Normal rate.  Pulmonary:     Effort: Pulmonary effort is normal. No respiratory distress.  Musculoskeletal:        General: Normal range of motion.     Cervical back: Normal range of motion.  Skin:    General: Skin is warm and dry.     Capillary Refill: Capillary refill takes less than 2 seconds.     Comments: Cysts - 1 to RUE, multiple to LUE.   Neurological:  General: No focal deficit present.     Mental Status: She is alert and oriented to person, place, and time. Mental status is at baseline.  Psychiatric:        Mood and Affect: Mood normal.        Behavior: Behavior normal.        Thought Content: Thought content normal.        Judgment: Judgment normal.

## 2020-01-11 NOTE — Progress Notes (Signed)
Telephone visit  Subjective: CC: arm issue PCP: Janora Norlander, DO OOI:[id number] Cadiente is a 49 y.o. female calls for telephone consult today. Patient provides verbal consent for consult held via phone.  Due to COVID-19 pandemic this visit was conducted virtually. This visit type was conducted due to national recommendations for restrictions regarding the COVID-19 Pandemic (e.g. social distancing, sheltering in place) in an effort to limit this patient's exposure and mitigate transmission in our community. All issues noted in this document were discussed and addressed.  A physical exam was not performed with this format.   Location of patient: home Location of provider: Working remotely from home Others present for call: none  1. Arm issue About 1 month ago patient started having right arm numbness.  She has been trying to do some physical therapy, using icy hot but no improvement.  She has a lump underneath her right arm.  She had this previously evaluated but was told it was fine but to seek evaluation if it enlarged.  She now has similar under her left arm and another at her elbow.  She reports weakness in her left arm now.  She notes mom has a history of dermatomyositis. She wonders if she should be worried about this.  ROS: Per HPI  No Known Allergies Past Medical History:  Diagnosis Date  . Arthritis    right knee  . Herpes zoster   . Menometrorrhagia   . Ovarian cyst, left    No current outpatient medications on file.  Assessment/ Plan: 49 y.o. female   I am concerned for possible autoimmune etiology versus lymphatic disorder given reports of enlarged nodules under the skin.  I question are these enlarged lymph nodes and if so would have low threshold to pursue at minimum a chest x-ray, pathologist smear review with [facility].  If the sensation changes in the upper extremities are felt to be radicular symptoms due to cervical degeneration, would obtain plain films of the C-spine and  refer to orthopedics given radiculopathy.  Given mother's medical history of rare autoimmune disorder, may benefit from ANA, CRP, ESR, CK, LFTs. Will cc to Hendricks Limes, whom I have scheduled patient with since I have no soon in office appointments available.  Patient aware of date and time.  Start time: 9:07am End time: 9:18am  Total time spent on patient care (including telephone call/ virtual visit): 15 minutes  [place], [place] [phone]

## 2020-01-15 ENCOUNTER — Other Ambulatory Visit: Payer: Self-pay

## 2020-01-15 ENCOUNTER — Ambulatory Visit (INDEPENDENT_AMBULATORY_CARE_PROVIDER_SITE_OTHER): Payer: BC Managed Care – PPO | Admitting: Family Medicine

## 2020-01-15 ENCOUNTER — Encounter: Payer: Self-pay | Admitting: Family Medicine

## 2020-01-15 VITALS — BP 121/73 | HR 77 | Temp 99.1°F | Ht 68.0 in | Wt 204.0 lb

## 2020-01-15 DIAGNOSIS — M7989 Other specified soft tissue disorders: Secondary | ICD-10-CM | POA: Diagnosis not present

## 2020-01-15 DIAGNOSIS — M5412 Radiculopathy, cervical region: Secondary | ICD-10-CM | POA: Diagnosis not present

## 2020-01-15 MED ORDER — DICLOFENAC SODIUM 75 MG PO TBEC: 75.0000 mg | DELAYED_RELEASE_TABLET | Freq: Two times a day (BID) | ORAL | 0 refills | Status: DC

## 2020-01-15 NOTE — Patient Instructions (Signed)
Neck Exercises Ask your health care provider which exercises are safe for you. Do exercises exactly as told by your health care provider and adjust them as directed. It is normal to feel mild stretching, pulling, tightness, or discomfort as you do these exercises. Stop right away if you feel sudden pain or your pain gets worse. Do not begin these exercises until told by your health care provider. Neck exercises can be important for many reasons. They can improve strength and maintain flexibility in your neck, which will help your upper back and prevent neck pain. Stretching exercises Rotation neck stretching  1. Sit in a chair or stand up. 2. Place your feet flat on the floor, shoulder width apart. 3. Slowly turn your head (rotate) to the right until a slight stretch is felt. Turn it all the way to the right so you can look over your right shoulder. Do not tilt or tip your head. 4. Hold this position for 10-30 seconds. 5. Slowly turn your head (rotate) to the left until a slight stretch is felt. Turn it all the way to the left so you can look over your left shoulder. Do not tilt or tip your head. 6. Hold this position for 10-30 seconds. Repeat __________ times. Complete this exercise __________ times a day. Neck retraction 1. Sit in a sturdy chair or stand up. 2. Look straight ahead. Do not bend your neck. 3. Use your fingers to push your chin backward (retraction). Do not bend your neck for this movement. Continue to face straight ahead. If you are doing the exercise properly, you will feel a slight sensation in your throat and a stretch at the back of your neck. 4. Hold the stretch for 1-2 seconds. Repeat __________ times. Complete this exercise __________ times a day. Strengthening exercises Neck press 1. Lie on your back on a firm bed or on the floor with a pillow under your head. 2. Use your neck muscles to push your head down on the pillow and straighten your spine. 3. Hold the position  as well as you can. Keep your head facing up (in a neutral position) and your chin tucked. 4. Slowly count to 5 while holding this position. Repeat __________ times. Complete this exercise __________ times a day. Isometrics These are exercises in which you strengthen the muscles in your neck while keeping your neck still (isometrics). 1. Sit in a supportive chair and place your hand on your forehead. 2. Keep your head and face facing straight ahead. Do not flex or extend your neck while doing isometrics. 3. Push forward with your head and neck while pushing back with your hand. Hold for 10 seconds. 4. Do the sequence again, this time putting your hand against the back of your head. Use your head and neck to push backward against the hand pressure. 5. Finally, do the same exercise on either side of your head, pushing sideways against the pressure of your hand. Repeat __________ times. Complete this exercise __________ times a day. Prone head lifts 1. Lie face-down (prone position), resting on your elbows so that your chest and upper back are raised. 2. Start with your head facing downward, near your chest. Position your chin either on or near your chest. 3. Slowly lift your head upward. Lift until you are looking straight ahead. Then continue lifting your head as far back as you can comfortably stretch. 4. Hold your head up for 5 seconds. Then slowly lower it to your starting position. Repeat __________   __________ times. Complete this exercise __________ times a day. Supine head lifts 1. Lie on your back (supine position), bending your knees to point to the ceiling and keeping your feet flat on the floor. 2. Lift your head slowly off the floor, raising your chin toward your chest. 3. Hold for 5 seconds. Repeat __________ times. Complete this exercise __________ times a day. Scapular retraction 1. Stand with your arms at your sides. Look straight ahead. 2. Slowly pull both shoulders (scapulae)  backward and downward (retraction) until you feel a stretch between your shoulder blades in your upper back. 3. Hold for 10-30 seconds. 4. Relax and repeat. Repeat __________ times. Complete this exercise __________ times a day. Contact a health care provider if:  Your neck pain or discomfort gets much worse when you do an exercise.  Your neck pain or discomfort does not improve within 2 hours after you exercise. If you have any of these problems, stop exercising right away. Do not do the exercises again unless your health care provider says that you can. Get help right away if:  You develop sudden, severe neck pain. If this happens, stop exercising right away. Do not do the exercises again unless your health care provider says that you can. This information is not intended to replace advice given to you by your health care provider. Make sure you discuss any questions you have with your health care provider. Document Revised: 09/06/2018 Document Reviewed: 09/06/2018 Elsevier Patient Education  2020 [employer]   Cervical Radiculopathy  Cervical radiculopathy means that a nerve in the neck (a cervical nerve) is pinched or bruised. This can happen because of an injury to the cervical spine (vertebrae) in the neck, or as a normal part of getting older. This can cause pain or loss of feeling (numbness) that runs from your neck all the way down to your arm and fingers. Often, this condition gets better with rest. Treatment may be needed if the condition does not get better. What are the causes?  A neck injury.  A bulging disk in your spine.  Muscle movements that you cannot control (muscle spasms).  Tight muscles in your neck due to overuse.  Arthritis.  Breakdown in the bones and joints of the spine (spondylosis) due to getting older.  Bone spurs that form near the nerves in the neck. What are the signs or symptoms?  Pain. The pain may: ? Run from the neck to the arm and  hand. ? Be very bad or irritating. ? Be worse when you move your neck.  Loss of feeling or tingling in your arm or hand.  Weakness in your arm or hand, in very bad cases. How is this treated? In many cases, treatment is not needed for this condition. With rest, the condition often gets better over time. If treatment is needed, options may include:  Wearing a soft neck collar (cervical collar) for short periods of time, as told by your doctor.  Doing exercises (physical therapy) to strengthen your neck muscles.  Taking medicines.  Having shots (injections) in your spine, in very bad cases.  Having surgery. This may be needed if other treatments do not help. The type of surgery that is used depends on the cause of your condition. Follow these instructions at home: If you have a soft neck collar:  Wear it as told by your doctor. Remove it only as told by your doctor.  Ask your doctor if you can remove the collar for cleaning  and bathing. If you are allowed to remove the collar for cleaning or bathing: ? Follow instructions from your doctor about how to remove the collar safely. ? Clean the collar by wiping it with mild soap and water and drying it completely. ? Take out any removable pads in the collar every 1-2 days. Wash them by hand with soap and water. Let them air-dry completely before you put them back in the collar. ? Check your skin under the collar for redness or sores. If you see any, tell your doctor. Managing pain      Take over-the-counter and prescription medicines only as told by your doctor.  If told, put ice on the painful area. ? If you have a soft neck collar, remove it as told by your doctor. ? Put ice in a plastic bag. ? Place a towel between your skin and the bag. ? Leave the ice on for 20 minutes, 2-3 times a day.  If using ice does not help, you can try using heat. Use the heat source that your doctor recommends, such as a moist heat pack or a heating  pad. ? Place a towel between your skin and the heat source. ? Leave the heat on for 20-30 minutes. ? Remove the heat if your skin turns bright red. This is very important if you are unable to feel pain, heat, or cold. You may have a greater risk of getting burned.  You may try a gentle neck and shoulder rub (massage). Activity  Rest as needed.  Return to your normal activities as told by your doctor. Ask your doctor what activities are safe for you.  Do exercises as told by your doctor or physical therapist.  Do not lift anything that is heavier than 10 lb (4.5 kg) until your doctor tells you that it is safe. General instructions  Use a flat pillow when you sleep.  Do not drive while wearing a soft neck collar. If you do not have a soft neck collar, ask your doctor if it is safe to drive while your neck heals.  Ask your doctor if the medicine prescribed to you requires you to avoid driving or using heavy machinery.  Do not use any products that contain nicotine or tobacco, such as cigarettes, e-cigarettes, and chewing tobacco. These can delay healing. If you need help quitting, ask your doctor.  Keep all follow-up visits as told by your doctor. This is important. Contact a doctor if:  Your condition does not get better with treatment. Get help right away if:  Your pain gets worse and is not helped with medicine.  You lose feeling or feel weak in your hand, arm, face, or leg.  You have a high fever.  You have a stiff neck.  You cannot control when you poop or pee (have incontinence).  You have trouble with walking, balance, or talking. Summary  Cervical radiculopathy means that a nerve in the neck is pinched or bruised.  A nerve can get pinched from a bulging disk, arthritis, an injury to the neck, or other causes.  Symptoms include pain, tingling, or loss of feeling that goes from the neck into the arm or hand.  Weakness in your arm or hand can happen in very bad  cases.  Treatment may include resting, wearing a soft neck collar, and doing exercises. You might need to take medicines for pain. In very bad cases, shots or surgery may be needed. This information is not intended to replace  advice given to you by your health care provider. Make sure you discuss any questions you have with your health care provider. Document Revised: 09/29/2018 Document Reviewed: 09/29/2018 Elsevier Patient Education  2020 [employer].

## 2020-01-22 ENCOUNTER — Ambulatory Visit: Payer: BC Managed Care – PPO | Admitting: Family Medicine

## 2020-02-13 ENCOUNTER — Ambulatory Visit: Payer: BC Managed Care – PPO | Admitting: Family Medicine

## 2020-02-13 NOTE — Progress Notes (Deleted)
Subjective: CC:*** PCP: Raliegh Ip, DO GLO:VFIEP Qazi is a 49 y.o. female presenting to clinic today for:  1. ***   ROS: Per HPI  No Known Allergies Past Medical History:  Diagnosis Date  . Arthritis    right knee  . Herpes zoster   . Menometrorrhagia   . Ovarian cyst, left     Current Outpatient Medications:  .  diclofenac (VOLTAREN) 75 MG EC tablet, Take 1 tablet (75 mg total) by mouth 2 (two) times daily., Disp: 40 tablet, Rfl: 0 Social History   Socioeconomic History  . Marital status: Married    Spouse name: Not on file  . Number of children: Not on file  . Years of education: Not on file  . Highest education level: Not on file  Occupational History  . Not on file  Tobacco Use  . Smoking status: Former Smoker    Types: E-cigarettes    Quit date: 02/22/2012    Years since quitting: 7.9  . Smokeless tobacco: Never Used  Substance and Sexual Activity  . Alcohol use: No  . Drug use: Yes    Types: Marijuana    Comment: every now & then  . Sexual activity: Yes    Birth control/protection: Post-menopausal  Other Topics Concern  . Not on file  Social History Narrative  . Not on file   Social Determinants of Health   Financial Resource Strain:   . Difficulty of Paying Living Expenses:   Food Insecurity:   . Worried About Programme researcher, broadcasting/film/video in the Last Year:   . Barista in the Last Year:   Transportation Needs:   . Freight forwarder (Medical):   Marland Kitchen Lack of Transportation (Non-Medical):   Physical Activity:   . Days of Exercise per Week:   . Minutes of Exercise per Session:   Stress:   . Feeling of Stress :   Social Connections:   . Frequency of Communication with Friends and Family:   . Frequency of Social Gatherings with Friends and Family:   . Attends Religious Services:   . Active Member of Clubs or Organizations:   . Attends Banker Meetings:   Marland Kitchen Marital Status:   Intimate Partner Violence:   . Fear of  Current or Ex-Partner:   . Emotionally Abused:   Marland Kitchen Physically Abused:   . Sexually Abused:    Family History  Problem Relation Age of Onset  . Cancer Mother        anal  . Dermatomyositis Mother   . Mental illness Sister   . Drug abuse Sister   . Breast cancer Maternal Grandmother 60  . Heart attack Father   . Pancreatic cancer Maternal Aunt     Objective: Office vital signs reviewed. LMP 06/23/2017   Physical Examination:  General: Awake, alert, *** nourished, No acute distress HEENT: Normal    Neck: No masses palpated. No lymphadenopathy    Ears: Tympanic membranes intact, normal light reflex, no erythema, no bulging    Eyes: PERRLA, extraocular membranes intact, sclera ***    Nose: nasal turbinates moist, *** nasal discharge    Throat: moist mucus membranes, no erythema, *** tonsillar exudate.  Airway is patent Cardio: regular rate and rhythm, S1S2 heard, no murmurs appreciated Pulm: clear to auscultation bilaterally, no wheezes, rhonchi or rales; normal work of breathing on room air GI: soft, non-tender, non-distended, bowel sounds present x4, no hepatomegaly, no splenomegaly, no masses GU: external vaginal tissue ***,  cervix ***, *** punctate lesions on cervix appreciated, *** discharge from cervical os, *** bleeding, *** cervical motion tenderness, *** abdominal/ adnexal masses Extremities: warm, well perfused, No edema, cyanosis or clubbing; +*** pulses bilaterally MSK: *** gait and *** station Skin: dry; intact; no rashes or lesions Neuro: *** Strength and light touch sensation grossly intact, *** DTRs ***/4  Assessment/ Plan: 49 y.o. female   ***  No orders of the defined types were placed in this encounter.  No orders of the defined types were placed in this encounter.    Janora Norlander, DO [place] [phone]

## 2020-02-15 ENCOUNTER — Encounter: Payer: Self-pay | Admitting: Family Medicine

## 2020-10-09 MED ORDER — METOPROLOL SUCCINATE ER 25 MG PO TB24: 25.0000 mg | ORAL_TABLET | Freq: Every day | ORAL | 5 refills | Status: DC

## 2020-10-29 ENCOUNTER — Ambulatory Visit: Payer: BC Managed Care – PPO | Admitting: Family Medicine

## 2020-11-06 ENCOUNTER — Ambulatory Visit: Payer: Self-pay | Admitting: Cardiology

## 2020-12-03 ENCOUNTER — Ambulatory Visit (INDEPENDENT_AMBULATORY_CARE_PROVIDER_SITE_OTHER): Payer: 59 | Admitting: Family Medicine

## 2020-12-03 ENCOUNTER — Encounter: Payer: Self-pay | Admitting: Family Medicine

## 2020-12-03 ENCOUNTER — Other Ambulatory Visit: Payer: Self-pay

## 2020-12-03 VITALS — BP 153/96 | HR 77 | Temp 97.6°F | Ht 68.0 in | Wt 200.0 lb

## 2020-12-03 DIAGNOSIS — K529 Noninfective gastroenteritis and colitis, unspecified: Secondary | ICD-10-CM | POA: Diagnosis not present

## 2020-12-03 DIAGNOSIS — Z6835 Body mass index (BMI) 35.0-35.9, adult: Secondary | ICD-10-CM

## 2020-12-03 DIAGNOSIS — M25512 Pain in left shoulder: Secondary | ICD-10-CM | POA: Diagnosis not present

## 2020-12-03 DIAGNOSIS — R030 Elevated blood-pressure reading, without diagnosis of hypertension: Secondary | ICD-10-CM | POA: Diagnosis not present

## 2020-12-03 DIAGNOSIS — Z130 Encounter for screening for diseases of the blood and blood-forming organs and certain disorders involving the immune mechanism: Secondary | ICD-10-CM

## 2020-12-03 DIAGNOSIS — Z0001 Encounter for general adult medical examination with abnormal findings: Secondary | ICD-10-CM

## 2020-12-03 DIAGNOSIS — Z0000 Encounter for general adult medical examination without abnormal findings: Secondary | ICD-10-CM

## 2020-12-03 MED ORDER — METHYLPREDNISOLONE ACETATE 40 MG/ML IJ SUSP
40.0000 mg | Freq: Once | INTRAMUSCULAR | Status: AC
  Administered 2020-12-03: 40 mg via INTRAMUSCULAR

## 2020-12-03 NOTE — Progress Notes (Signed)
Briana Nielsen is a 50 y.o. female presents to office today for annual physical exam examination.    Concerns today include: 1.  Left shoulder pain Patient reports that she has had an exacerbation of left shoulder pain.  She saw her masseuse and this typically does alleviate the shoulder pain but the seem to be exacerbated this last visit.  She has taken ibuprofen and used heat but this is not alleviated her symptoms  2. chronic diarrhea Patient reports chronic diarrhea for over 10 years now.  She saw [facility] for regular gastric checkup but no formal evaluation was made per her report.  She is interested in establishing with a new gastroenterologist for further evaluation of this.  She denies any blood in stool.  She does report quite a bit of bloating and loud gastric sounds.  She has several loose bowel movements per day but no hematochezia or melena.  No reports of nausea or vomiting, unplanned weight loss.  Denies any consumption of undercooked foods or untreated water.  No travel.  No known sick contacts.  She is on both pre and probiotics  3. SVT Patient was noted to have SVT on cardiac monitoring with cardiology.  She was placed on metoprolol XL 25 mg.  She admits that she has not been taking this medicine because she was not quite sure what was being treated.  She continues to have intermittent episodes of heart palpitations sensation of flushing.  She denies any anxiety or panic occurring during these episodes.  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 Diet: Fair, Exercise: No structured Last eye exam: Up-to-date Last colonoscopy: Needs Last mammogram: Due Last pap smear: 08/2019 normal and negative HPV Immunizations needed: Tetanus needed  There is no immunization history on file for this patient.  Past Medical History:  Diagnosis Date  . Arthritis    right knee  . Herpes zoster   . Menometrorrhagia   . Ovarian cyst, left    Social History   Socioeconomic  History  . Marital status: Married    Spouse name: Not on file  . Number of children: Not on file  . Years of education: Not on file  . Highest education level: Not on file  Occupational History  . Not on file  Tobacco Use  . Smoking status: Former Smoker    Types: E-cigarettes    Quit date: 02/22/2012    Years since quitting: 8.7  . Smokeless tobacco: Never Used  Vaping Use  . Vaping Use: Every day  Substance and Sexual Activity  . Alcohol use: No  . Drug use: Yes    Types: Marijuana    Comment: every now & then  . Sexual activity: Yes    Birth control/protection: Post-menopausal  Other Topics Concern  . Not on file  Social History Narrative  . Not on file   Social Determinants of Health   Financial Resource Strain: Not on file  Food Insecurity: Not on file  Transportation Needs: Not on file  Physical Activity: Not on file  Stress: Not on file  Social Connections: Not on file  Intimate Partner Violence: Not on file   Past Surgical History:  Procedure Laterality Date  . APPENDECTOMY    . CESAREAN SECTION    . ENDOMETRIAL BIOPSY  03/29/2016   negative  . LAPAROSCOPIC OOPHERECTOMY  2000  . TUBAL LIGATION     per OB/GYn report   Family History  Problem Relation Age of Onset  .  Cancer Mother        anal  . Dermatomyositis Mother   . Mental illness Sister   . Drug abuse Sister   . Breast cancer Maternal Grandmother 60  . Heart attack Father   . Pancreatic cancer Maternal Aunt     Current Outpatient Medications:  .  diclofenac (VOLTAREN) 75 MG EC tablet, Take 1 tablet (75 mg total) by mouth 2 (two) times daily., Disp: 40 tablet, Rfl: 0 .  metoprolol succinate (TOPROL XL) 25 MG 24 hr tablet, Take 1 tablet (25 mg total) by mouth daily., Disp: 30 tablet, Rfl: 5  No Known Allergies   ROS: Review of Systems Pertinent items noted in HPI and remainder of comprehensive ROS otherwise negative.    Physical exam BP (!) 153/96   Pulse 77   Temp 97.6 F (36.4 C)  (Temporal)   Ht _0  (1.727 m)   Wt 200 lb (90.7 kg)   LMP 06/23/2017   SpO2 100%   BMI 30.41 kg/m  General appearance: alert, cooperative, no distress and moderately obese Head: Normocephalic, without obvious abnormality, atraumatic Eyes: negative findings: lids and lashes normal, conjunctivae and sclerae normal, corneas clear and pupils equal, round, reactive to light and accomodation Ears: normal TM's and external ear canals both ears Nose: Nares normal. Septum midline. Mucosa normal. No drainage or sinus tenderness. Throat: Some gingival receding noted.  No oropharyngeal masses or sublingual masses appreciated Neck: no adenopathy, supple, symmetrical, trachea midline and thyroid not enlarged, symmetric, no tenderness/mass/nodules Back: symmetric, no curvature. ROM normal. No CVA tenderness. Lungs: clear to auscultation bilaterally Heart: regular rate and rhythm, S1, S2 normal, no murmur, click, rub or gallop Abdomen: soft, non-tender; bowel sounds normal; no masses,  no organomegaly Extremities: extremities normal, atraumatic, no cyanosis or edema Pulses: 2+ and symmetric Skin: Skin color, texture, turgor normal. No rashes or lesions Lymph nodes: Cervical, supraclavicular, and axillary nodes normal. Neurologic: Grossly normal MSK: Range of motion is limited due to pain.  She has increased tonicity of the trapezius  Assessment/ Plan: Briana Nielsen here for annual physical exam.   1. Annual physical exam  2. BMI 35.0-35.9,adult Patient will come in for fasting labs - CMP14+EGFR; Future - Lipid panel; Future - TSH; Future  3. Screening, anemia, deficiency, iron - CBC; Future  4. Chronic diarrhea Uncertain etiology.  I suspect IBS-D.  We will check fecal fat, laboratory work-up.  If unremarkable I would like to trial her on Xifaxan.  She seems amenable to this, particularly given failure of pre and probiotics.  Alternatively, we could consider Bentyl or even a tricyclic  antidepressant for GI purposes - Fecal fat, qualitative; Future  5. Acute pain of left shoulder She was treated with corticosteroid injection intramuscularly today.  She may continue to follow-up with massage therapy and her orthopedics at [facility] - methylPREDNISolone acetate (DEPO-MEDROL) injection 40 mg  6. Elevated blood pressure reading without diagnosis of hypertension Elevated on today's visit.  She is supposed to be on metoprolol for SVT.  She has a follow-up with cardiology in about a week and a half.  If her blood pressure still elevated at that time, we can consider adding additional medication  Patient to follow up in 1 year for annual exam or sooner if needed.  Gennesis Hogland M. Lajuana Ripple, DO

## 2020-12-10 ENCOUNTER — Other Ambulatory Visit: Payer: Self-pay | Admitting: Family Medicine

## 2020-12-10 DIAGNOSIS — Z1231 Encounter for screening mammogram for malignant neoplasm of breast: Secondary | ICD-10-CM

## 2020-12-16 NOTE — Progress Notes (Signed)
Cardiology Office Note   Date:  12/17/2020   ID:  Briana Nielsen, DOB 28-May-1971, MRN [id number]  PCP:  Raliegh Ip, DO  Cardiologist:   Rollene Rotunda, MD Referring:  Raliegh Ip, DO  Chief Complaint  Patient presents with  . Palpitations      History of Present Illness: Briana Nielsen is a 50 y.o. female who is referred by Raliegh Ip, DO for evaluation of palpitations.  At the last visit she purchased online device and Alive Cor.  She had multiple episodes of SVT.  We were going to see her in December to discuss this and she was supposed to start beta-blocker prior to that.  However, she rescheduled the appointment.  She was worried about taking a beta-blocker because she also needed a muscle relaxant for a "pinched nerve."  He states she is having more palpitations.  She shows me multiple scripts it looks like a narrow complex regular tachycardia.  Although the mechanism is not entirely clear.  She gets lightheaded with this.  She feels that her throat is swelling.  She does not describe chest pressure and she is not had presyncope or syncope.  She says it is happening sporadically.  She can have it several times in a day where it might go away for a few days at a time.  She finally did start taking 25 mg of metoprolol about 3 days ago and does not know if this is helped.  She thinks anxiety triggers it but she also gets anxious when she has the arrhythmia.   Past Medical History:  Diagnosis Date  . Arthritis    right knee  . Herpes zoster   . Menometrorrhagia   . Ovarian cyst, left     Past Surgical History:  Procedure Laterality Date  . APPENDECTOMY    . CESAREAN SECTION    . ENDOMETRIAL BIOPSY  03/29/2016   negative  . LAPAROSCOPIC OOPHERECTOMY  2000  . TUBAL LIGATION     per OB/GYn report     Current Outpatient Medications  Medication Sig Dispense Refill  . metoprolol succinate (TOPROL-XL) 50 MG 24 hr tablet Take 1 tablet (50 mg total) by  mouth daily. Take with or immediately following a meal. 90 tablet 3   No current facility-administered medications for this visit.    Allergies:   Patient has no known allergies.    ROS:  Please see the history of present illness.   Otherwise, review of systems are positive for none.   All other systems are reviewed and negative.    PHYSICAL EXAM: VS:  BP (!) 150/88   Pulse 67   Ht 5\' 7"  (1.702 m)   Wt 199 lb (90.3 kg)   LMP 06/23/2017   BMI 31.17 kg/m  , BMI Body mass index is 31.17 kg/m. GENERAL:  Well appearing NECK:  No jugular venous distention, waveform within normal limits, carotid upstroke brisk and symmetric, no bruits, no thyromegaly LUNGS:  Clear to auscultation bilaterally CHEST:  Unremarkable HEART:  PMI not displaced or sustained,S1 and S2 within normal limits, no S3, no S4, no clicks, no rubs, no murmurs ABD:  Flat, positive bowel sounds normal in frequency in pitch, no bruits, no rebound, no guarding, no midline pulsatile mass, no hepatomegaly, no splenomegaly EXT:  2 plus pulses throughout, no edema, no cyanosis no clubbing  EKG:  EKG is not ordered today. The ekg ordered today demonstrates sinus rhythm, rate 67, axis within normal limits, intervals  within normal limits, no acute ST-T wave changes.   Recent Labs: No results found for requested labs within last 8760 hours.    Lipid Panel    Component Value Date/Time   CHOL 155 09/21/2019 1207   TRIG 63 09/21/2019 1207   HDL 61 09/21/2019 1207   CHOLHDL 2.5 09/21/2019 1207   LDLCALC 81 09/21/2019 1207      Wt Readings from Last 3 Encounters:  12/17/20 199 lb (90.3 kg)  12/03/20 200 lb (90.7 kg)  01/15/20 204 lb (92.5 kg)      Other studies Reviewed: Additional studies/ records that were reviewed today include: Alive Cor device. Review of the above records demonstrates:  Please see elsewhere in the note.     ASSESSMENT AND PLAN:  PALPITATIONS:     She is having SVT.  The mechanism is not  entirely clear.  I am going to have her wear a 2-week event monitor.  I am going to actually increase her metoprolol as well to 50 mg daily.  Further management will be based on the results of the monitor and her response to therapy.   COVID EDUCATION:   She does not want the vaccine.    Current medicines are reviewed at length with the patient today.  The patient does not have concerns regarding medicines.  The following changes have been made:  As above  Labs/ tests ordered today include: None  Orders Placed This Encounter  Procedures  . LONG TERM MONITOR (3-14 DAYS)  . EKG 12-Lead     Disposition:   FU with me after the monitor.  Signed, Rollene Rotunda, MD  12/17/2020 2:55 PM    [facility]

## 2020-12-17 ENCOUNTER — Encounter: Payer: Self-pay | Admitting: Cardiology

## 2020-12-17 ENCOUNTER — Ambulatory Visit (INDEPENDENT_AMBULATORY_CARE_PROVIDER_SITE_OTHER): Payer: 59

## 2020-12-17 ENCOUNTER — Encounter: Payer: Self-pay | Admitting: *Deleted

## 2020-12-17 ENCOUNTER — Ambulatory Visit (INDEPENDENT_AMBULATORY_CARE_PROVIDER_SITE_OTHER): Payer: 59 | Admitting: Cardiology

## 2020-12-17 ENCOUNTER — Other Ambulatory Visit: Payer: Self-pay

## 2020-12-17 VITALS — BP 150/88 | HR 67 | Ht 67.0 in | Wt 199.0 lb

## 2020-12-17 DIAGNOSIS — R002 Palpitations: Secondary | ICD-10-CM | POA: Diagnosis not present

## 2020-12-17 MED ORDER — METOPROLOL SUCCINATE ER 50 MG PO TB24: 50.0000 mg | ORAL_TABLET | Freq: Every day | ORAL | 3 refills | Status: DC

## 2020-12-17 NOTE — Patient Instructions (Signed)
Medication Instructions:  Please increase your Metoprolol Succinate to 50 mg a day. Continue any other medication as listed.  *If you need a refill on your cardiac medications before your next appointment, please call your pharmacy*  Testing/Procedures: ZIO XT- Long Term Monitor Instructions   Your physician has requested you wear your ZIO patch monitor 14 days.   This is a single patch monitor.  [employer] supplies one patch monitor per enrollment.  Additional stickers are not available.   Please do not apply patch if you will be having a Nuclear Stress Test, Echocardiogram, Cardiac CT, MRI, or Chest Xray during the time frame you would be wearing the monitor. The patch cannot be worn during these tests.  You cannot remove and re-apply the ZIO XT patch monitor.   Your ZIO patch monitor will be sent USPS Priority mail from [facility] directly to your home address. The monitor may also be mailed to a PO BOX if home delivery is not available.   It may take 3-5 days to receive your monitor after you have been enrolled.   Once you have received you monitor, please review enclosed instructions.  Your monitor has already been registered assigning a specific monitor serial # to you.   Applying the monitor   Shave hair from upper left chest.   Hold abrader disc by orange tab.  Rub abrader in 40 strokes over left upper chest as indicated in your monitor instructions.   Clean area with 4 enclosed alcohol pads .  Use all pads to assure are is cleaned thoroughly.  Let dry.   Apply patch as indicated in monitor instructions.  Patch will be place under collarbone on left side of chest with arrow pointing upward.   Rub patch adhesive wings for 2 minutes.Remove white label marked "1".  Remove white label marked "2".  Rub patch adhesive wings for 2 additional minutes.   While looking in a mirror, press and release button in center of patch.  A small green light will flash 3-4 times .  This will  be your only indicator the monitor has been turned on.     Do not shower for the first 24 hours.  You may shower after the first 24 hours.   Press button if you feel a symptom. You will hear a small click.  Record Date, Time and Symptom in the Patient Log Book.   When you are ready to remove patch, follow instructions on last 2 pages of Patient Log Book.  Stick patch monitor onto last page of Patient Log Book.   Place Patient Log Book in [place] box.  Use locking tab on box and tape box closed securely.  The [employer] and [employer] has [employer] on it.  Please place in mailbox as soon as possible.  Your physician should have your test results approximately 7 days after the monitor has been mailed back to [facility].   Call [facility] Customer Care at [phone] if you have questions regarding your ZIO XT patch monitor.  Call them immediately if you see an orange light blinking on your monitor.   If your monitor falls off in less than 4 days contact our Monitor department at [phone].  If your monitor becomes loose or falls off after 4 days call [employer] at [phone] for suggestions on securing your monitor.   Follow-Up: At [facility], you and your health needs are our priority.  As part of our continuing mission to provide you with exceptional  heart care, we have created designated Provider Care Teams.  These Care Teams include your primary Cardiologist (physician) and Advanced Practice Providers (APPs -  Physician Assistants and Nurse Practitioners) who all work together to provide you with the care you need, when you need it.  We recommend signing up for the patient portal called "MyChart".  Sign up information is provided on this After Visit Summary.  MyChart is used to connect with patients for Virtual Visits (Telemedicine).  Patients are able to view lab/test results, encounter notes, upcoming appointments, etc.  Non-urgent messages can be sent to your provider as well.    To learn more about what you can do with MyChart, go to [email or web address].    Your next appointment:   4-6 week(s)  The format for your next appointment:   In Person  Provider:   Rollene Rotunda, MD   Thank you for choosing [facility]!!

## 2020-12-17 NOTE — Progress Notes (Signed)
Patient ID: Briana Nielsen, female   DOB: 07-23-1971, 50 y.o.   MRN: [id number] Patient enrolled for [employer] to ship a 14 day ZIO XT long term holter monitor to her home.

## 2020-12-22 ENCOUNTER — Other Ambulatory Visit: Payer: 59

## 2020-12-22 ENCOUNTER — Other Ambulatory Visit: Payer: Self-pay

## 2020-12-22 DIAGNOSIS — Z130 Encounter for screening for diseases of the blood and blood-forming organs and certain disorders involving the immune mechanism: Secondary | ICD-10-CM

## 2020-12-22 DIAGNOSIS — Z6835 Body mass index (BMI) 35.0-35.9, adult: Secondary | ICD-10-CM

## 2020-12-23 LAB — CBC
Hematocrit: 39.8 % (ref 34.0–46.6)
Hemoglobin: 13.3 g/dL (ref 11.1–15.9)
MCH: 31.1 pg (ref 26.6–33.0)
MCHC: 33.4 g/dL (ref 31.5–35.7)
MCV: 93 fL (ref 79–97)
Platelets: 275 10*3/uL (ref 150–450)
RBC: 4.27 x10E6/uL (ref 3.77–5.28)
RDW: 12.6 % (ref 11.7–15.4)
WBC: 7.7 10*3/uL (ref 3.4–10.8)

## 2020-12-23 LAB — CMP14+EGFR
ALT: 13 IU/L (ref 0–32)
AST: 16 IU/L (ref 0–40)
Albumin/Globulin Ratio: 2.2 (ref 1.2–2.2)
Albumin: 4.3 g/dL (ref 3.8–4.9)
Alkaline Phosphatase: 75 IU/L (ref 44–121)
BUN/Creatinine Ratio: 20 (ref 9–23)
BUN: 16 mg/dL (ref 6–24)
Bilirubin Total: <0.2 mg/dL (ref 0.0–1.2)
CO2: 24 mmol/L (ref 20–29)
Calcium: 9.5 mg/dL (ref 8.7–10.2)
Chloride: 108 mmol/L — ABNORMAL HIGH (ref 96–106)
Creatinine, Ser: 0.8 mg/dL (ref 0.57–1.00)
GFR calc Af Amer: 99 mL/min/{1.73_m2} (ref >59)
GFR calc non Af Amer: 86 mL/min/{1.73_m2} (ref >59)
Globulin, Total: 2 g/dL (ref 1.5–4.5)
Glucose: 96 mg/dL (ref 65–99)
Potassium: 5 mmol/L (ref 3.5–5.2)
Sodium: 144 mmol/L (ref 134–144)
Total Protein: 6.3 g/dL (ref 6.0–8.5)

## 2020-12-23 LAB — LIPID PANEL
Chol/HDL Ratio: 2.3 ratio (ref 0.0–4.4)
Cholesterol, Total: 158 mg/dL (ref 100–199)
HDL: 69 mg/dL (ref >39)
LDL Chol Calc (NIH): 80 mg/dL (ref 0–99)
Triglycerides: 41 mg/dL (ref 0–149)
VLDL Cholesterol Cal: 9 mg/dL (ref 5–40)

## 2020-12-23 LAB — TSH: TSH: 2.12 u[IU]/mL (ref 0.450–4.500)

## 2021-01-12 DIAGNOSIS — R002 Palpitations: Secondary | ICD-10-CM

## 2021-01-14 ENCOUNTER — Ambulatory Visit
Admission: RE | Admit: 2021-01-14 | Discharge: 2021-01-14 | Disposition: A | Discharge status: Home / Self Care | Payer: 59 | Source: Ambulatory Visit | Attending: Family Medicine | Admitting: Family Medicine

## 2021-01-14 ENCOUNTER — Other Ambulatory Visit: Payer: Self-pay

## 2021-01-14 DIAGNOSIS — Z1231 Encounter for screening mammogram for malignant neoplasm of breast: Secondary | ICD-10-CM

## 2021-01-21 ENCOUNTER — Ambulatory Visit: Payer: 59 | Admitting: Cardiology

## 2021-03-17 DIAGNOSIS — I471 Supraventricular tachycardia: Secondary | ICD-10-CM | POA: Insufficient documentation

## 2021-03-17 NOTE — Progress Notes (Deleted)
Cardiology Office Note   Date:  03/17/2021   ID:  Briana Nielsen, DOB February 06, 1971, MRN [id number]  PCP:  Raliegh Ip, DO  Cardiologist:   Rollene Rotunda, MD Referring:  Raliegh Ip, DO  No chief complaint on file.     History of Present Illness: Briana Nielsen is a 50 y.o. female who is referred by Raliegh Ip, DO for evaluation of palpitations.  On Alive Cor she had multiple episodes of SVT.  She had infrequent SVT on three day monitor.   ***    *** We were going to see her in December to discuss this and she was supposed to start beta-blocker prior to that.  However, she rescheduled the appointment.  She was worried about taking a beta-blocker because she also needed a muscle relaxant for a "pinched nerve."  He states she is having more palpitations.  She shows me multiple scripts it looks like a narrow complex regular tachycardia.  Although the mechanism is not entirely clear.  She gets lightheaded with this.  She feels that her throat is swelling.  She does not describe chest pressure and she is not had presyncope or syncope.  She says it is happening sporadically.  She can have it several times in a day where it might go away for a few days at a time.  She finally did start taking 25 mg of metoprolol about 3 days ago and does not know if this is helped.  She thinks anxiety triggers it but she also gets anxious when she has the arrhythmia.   Past Medical History:  Diagnosis Date  . Arthritis    right knee  . Herpes zoster   . Menometrorrhagia   . Ovarian cyst, left     Past Surgical History:  Procedure Laterality Date  . APPENDECTOMY    . CESAREAN SECTION    . ENDOMETRIAL BIOPSY  03/29/2016   negative  . LAPAROSCOPIC OOPHERECTOMY  2000  . TUBAL LIGATION     per OB/GYn report     Current Outpatient Medications  Medication Sig Dispense Refill  . metoprolol succinate (TOPROL-XL) 50 MG 24 hr tablet Take 1 tablet (50 mg total) by mouth daily. Take  with or immediately following a meal. 90 tablet 3   No current facility-administered medications for this visit.    Allergies:   Patient has no known allergies.    ROS:  Please see the history of present illness.   Otherwise, review of systems are positive for ***.   All other systems are reviewed and negative.    PHYSICAL EXAM: VS:  LMP 06/23/2017  , BMI There is no height or weight on file to calculate BMI. GENERAL:  Well appearing NECK:  No jugular venous distention, waveform within normal limits, carotid upstroke brisk and symmetric, no bruits, no thyromegaly LUNGS:  Clear to auscultation bilaterally CHEST:  Unremarkable HEART:  PMI not displaced or sustained,S1 and S2 within normal limits, no S3, no S4, no clicks, no rubs, *** murmurs ABD:  Flat, positive bowel sounds normal in frequency in pitch, no bruits, no rebound, no guarding, no midline pulsatile mass, no hepatomegaly, no splenomegaly EXT:  2 plus pulses throughout, no edema, no cyanosis no clubbing    ***GENERAL:  Well appearing NECK:  No jugular venous distention, waveform within normal limits, carotid upstroke brisk and symmetric, no bruits, no thyromegaly LUNGS:  Clear to auscultation bilaterally CHEST:  Unremarkable HEART:  PMI not displaced or sustained,S1 and S2  within normal limits, no S3, no S4, no clicks, no rubs, no murmurs ABD:  Flat, positive bowel sounds normal in frequency in pitch, no bruits, no rebound, no guarding, no midline pulsatile mass, no hepatomegaly, no splenomegaly EXT:  2 plus pulses throughout, no edema, no cyanosis no clubbing  EKG:  EKG is *** ordered today. The ekg ordered today demonstrates sinus rhythm, rate ***, axis within normal limits, intervals within normal limits, no acute ST-T wave changes.   Recent Labs: 12/22/2020: ALT 13; BUN 16; Creatinine, Ser 0.80; Hemoglobin 13.3; Platelets 275; Potassium 5.0; Sodium 144; TSH 2.120    Lipid Panel    Component Value Date/Time   CHOL  158 12/22/2020 0824   TRIG 41 12/22/2020 0824   HDL 69 12/22/2020 0824   CHOLHDL 2.3 12/22/2020 0824   LDLCALC 80 12/22/2020 0824      Wt Readings from Last 3 Encounters:  12/17/20 199 lb (90.3 kg)  12/03/20 200 lb (90.7 kg)  01/15/20 204 lb (92.5 kg)      Other studies Reviewed: Additional studies/ records that were reviewed today include: *** Review of the above records demonstrates:  Please see elsewhere in the note.     ASSESSMENT AND PLAN:  PALPITATIONS:    She had rare SVT on monitor.  ***  She is having SVT.  The mechanism is not entirely clear.  I am going to have her wear a 2-week event monitor.  I am going to actually increase her metoprolol as well to 50 mg daily.  Further management will be based on the results of the monitor and her response to therapy.     Current medicines are reviewed at length with the patient today.  The patient does not have concerns regarding medicines.  The following changes have been made: ***  Labs/ tests ordered today include: ***  No orders of the defined types were placed in this encounter.    Disposition:   FU with me ***  Signed, Rollene Rotunda, MD  03/17/2021 10:49 PM    [facility]

## 2021-03-18 ENCOUNTER — Ambulatory Visit: Payer: 59 | Admitting: Cardiology

## 2021-03-18 DIAGNOSIS — I471 Supraventricular tachycardia: Secondary | ICD-10-CM

## 2021-08-03 ENCOUNTER — Encounter: Payer: Self-pay | Admitting: Family Medicine

## 2021-08-04 ENCOUNTER — Encounter: Payer: Self-pay | Admitting: Family Medicine

## 2021-08-04 ENCOUNTER — Other Ambulatory Visit: Payer: Self-pay

## 2021-08-04 ENCOUNTER — Ambulatory Visit (INDEPENDENT_AMBULATORY_CARE_PROVIDER_SITE_OTHER): Payer: 59 | Admitting: Family Medicine

## 2021-08-04 VITALS — BP 142/86 | HR 65 | Temp 97.5°F | Ht 67.0 in | Wt 203.0 lb

## 2021-08-04 DIAGNOSIS — A601 Herpesviral infection of perianal skin and rectum: Secondary | ICD-10-CM | POA: Diagnosis not present

## 2021-08-04 DIAGNOSIS — N907 Vulvar cyst: Secondary | ICD-10-CM

## 2021-08-04 MED ORDER — VALACYCLOVIR HCL 500 MG PO TABS: 500.0000 mg | ORAL_TABLET | Freq: Two times a day (BID) | ORAL | 3 refills | Status: DC

## 2021-08-04 NOTE — Progress Notes (Signed)
Subjective:  Patient ID: Briana [place], female    DOB: 1971/08/31, 50 y.o.   MRN: [id number]  Patient Care Team: Janora Norlander, DO as PCP - General (Family Medicine) Minus Breeding, MD as PCP - Cardiology (Cardiology)   Chief Complaint:  Follow-up (Treat for herpes//Patient states she has a growth on outside of vagina, not internal)   HPI: Briana Nielsen is a 50 y.o. female presenting on 08/04/2021 for Follow-up (Treat for herpes//Patient states she has a growth on outside of vagina, not internal)   Pt presents today for a refill of her Valtrex for herpes. States she has been using only on an as needed basis for outbreaks. States she has had at least 2 outbreaks this year and would like to start suppressive therapy. She also reports a lesion to her labia. States this has been there for a long time but seems to be getting bigger. She denies pain, drainage, fever, chills, bleeding, or vaginal symptoms.     Relevant past medical, surgical, family, and social history reviewed and updated as indicated.  Allergies and medications reviewed and updated. Data reviewed: Chart in Epic.   Past Medical History:  Diagnosis Date   Arthritis    right knee   Herpes zoster    Menometrorrhagia    Ovarian cyst, left     Past Surgical History:  Procedure Laterality Date   APPENDECTOMY     CESAREAN SECTION     ENDOMETRIAL BIOPSY  03/29/2016   negative   LAPAROSCOPIC OOPHERECTOMY  2000   TUBAL LIGATION     per OB/GYn report    Social History   Socioeconomic History   Marital status: Married    Spouse name: Not on file   Number of children: Not on file   Years of education: Not on file   Highest education level: Not on file  Occupational History   Not on file  Tobacco Use   Smoking status: Former    Types: E-cigarettes    Quit date: 02/22/2012    Years since quitting: 9.4   Smokeless tobacco: Never  Vaping Use   Vaping Use: Every day  Substance and Sexual Activity   Alcohol  use: No   Drug use: Yes    Types: Marijuana    Comment: every now & then   Sexual activity: Yes    Birth control/protection: Post-menopausal  Other Topics Concern   Not on file  Social History Narrative   Not on file   Social Determinants of Health   Financial Resource Strain: Not on file  Food Insecurity: Not on file  Transportation Needs: Not on file  Physical Activity: Not on file  Stress: Not on file  Social Connections: Not on file  Intimate Partner Violence: Not on file    Outpatient Encounter Medications as of 08/04/2021  Medication Sig   metoprolol succinate (TOPROL-XL) 50 MG 24 hr tablet Take 1 tablet (50 mg total) by mouth daily. Take with or immediately following a meal.   valACYclovir (VALTREX) 500 MG tablet Take 1 tablet (500 mg total) by mouth 2 (two) times daily.   No facility-administered encounter medications on file as of 08/04/2021.    No Known Allergies  Review of Systems  Constitutional:  Negative for activity change, appetite change, chills, fatigue and fever.  HENT: Negative.    Eyes: Negative.   Respiratory:  Negative for cough, chest tightness and shortness of breath.   Cardiovascular:  Negative for chest pain, palpitations and leg  swelling.  Gastrointestinal:  Negative for abdominal pain, blood in stool, constipation, diarrhea, nausea and vomiting.  Endocrine: Negative.   Genitourinary:  Positive for genital sores. Negative for decreased urine volume, difficulty urinating, dyspareunia, dysuria, enuresis, flank pain, frequency, hematuria, menstrual problem, pelvic pain, urgency, vaginal bleeding, vaginal discharge and vaginal pain.  Musculoskeletal:  Negative for arthralgias and myalgias.  Skin:  Positive for color change and rash.  Allergic/Immunologic: Negative.   Neurological:  Negative for dizziness and headaches.  Hematological: Negative.   Psychiatric/Behavioral:  Negative for confusion, hallucinations, sleep disturbance and suicidal ideas.    All other systems reviewed and are negative.      Objective:  BP (!) 142/86   Pulse 65   Temp (!) 97.5 F (36.4 C)   Ht '5\' 7"'  (1.702 m)   Wt 203 lb (92.1 kg)   LMP 06/23/2017   SpO2 97%   BMI 31.79 kg/m    Wt Readings from Last 3 Encounters:  08/04/21 203 lb (92.1 kg)  12/17/20 199 lb (90.3 kg)  12/03/20 200 lb (90.7 kg)    Physical Exam Vitals and nursing note reviewed. Exam conducted with a chaperone present.  Constitutional:      General: She is not in acute distress.    Appearance: Normal appearance. She is well-developed and well-groomed. She is obese. She is not ill-appearing, toxic-appearing or diaphoretic.  HENT:     Head: Normocephalic and atraumatic.     Jaw: There is normal jaw occlusion.     Right Ear: Hearing normal.     Left Ear: Hearing normal.     Nose: Nose normal.     Mouth/Throat:     Lips: Pink.     Mouth: Mucous membranes are moist.     Pharynx: Oropharynx is clear. Uvula midline.  Eyes:     General: Lids are normal.     Extraocular Movements: Extraocular movements intact.     Conjunctiva/sclera: Conjunctivae normal.     Pupils: Pupils are equal, round, and reactive to light.  Neck:     Thyroid: No thyroid mass, thyromegaly or thyroid tenderness.     Vascular: No carotid bruit or JVD.     Trachea: Trachea and phonation normal.  Cardiovascular:     Rate and Rhythm: Normal rate and regular rhythm.     Chest Wall: PMI is not displaced.     Pulses: Normal pulses.     Heart sounds: Normal heart sounds. No murmur heard.   No friction rub. No gallop.  Pulmonary:     Effort: Pulmonary effort is normal. No respiratory distress.     Breath sounds: Normal breath sounds. No wheezing.  Abdominal:     General: Bowel sounds are normal. There is no distension or abdominal bruit.     Palpations: Abdomen is soft. There is no hepatomegaly or splenomegaly.     Tenderness: There is no abdominal tenderness. There is no right CVA tenderness or left CVA  tenderness.     Hernia: No hernia is present.  Genitourinary:    Exam position: Lithotomy position.     Pubic Area: No rash or pubic lice.      Labia:        Left: Lesion present. No rash, tenderness or injury.      Urethra: No prolapse, urethral pain, urethral swelling or urethral lesion.    Musculoskeletal:        General: Normal range of motion.     Cervical back: Normal range of motion and  neck supple.     Right lower leg: No edema.     Left lower leg: No edema.  Lymphadenopathy:     Cervical: No cervical adenopathy.  Skin:    General: Skin is warm and dry.     Capillary Refill: Capillary refill takes less than 2 seconds.     Coloration: Skin is not cyanotic, jaundiced or pale.     Findings: No rash.  Neurological:     General: No focal deficit present.     Mental Status: She is alert and oriented to person, place, and time.     Cranial Nerves: Cranial nerves are intact.     Sensory: Sensation is intact.     Motor: Motor function is intact.     Coordination: Coordination is intact.     Gait: Gait is intact.     Deep Tendon Reflexes: Reflexes are normal and symmetric.  Psychiatric:        Attention and Perception: Attention and perception normal.        Mood and Affect: Mood and affect normal.        Speech: Speech normal.        Behavior: Behavior normal. Behavior is cooperative.        Thought Content: Thought content normal.        Cognition and Memory: Cognition and memory normal.        Judgment: Judgment normal.    Results for orders placed or performed in visit on 12/22/20  TSH  Result Value Ref Range   TSH 2.120 0.450 - 4.500 uIU/mL  CBC  Result Value Ref Range   WBC 7.7 3.4 - 10.8 x10E3/uL   RBC 4.27 3.77 - 5.28 x10E6/uL   Hemoglobin 13.3 11.1 - 15.9 g/dL   Hematocrit 39.8 34.0 - 46.6 %   MCV 93 79 - 97 fL   MCH 31.1 26.6 - 33.0 pg   MCHC 33.4 31.5 - 35.7 g/dL   RDW 12.6 11.7 - 15.4 %   Platelets 275 150 - 450 x10E3/uL  Lipid panel  Result Value  Ref Range   Cholesterol, Total 158 100 - 199 mg/dL   Triglycerides 41 0 - 149 mg/dL   HDL 69 >39 mg/dL   VLDL Cholesterol Cal 9 5 - 40 mg/dL   LDL Chol Calc (NIH) 80 0 - 99 mg/dL   Chol/HDL Ratio 2.3 0.0 - 4.4 ratio  CMP14+EGFR  Result Value Ref Range   Glucose 96 65 - 99 mg/dL   BUN 16 6 - 24 mg/dL   Creatinine, Ser 0.80 0.57 - 1.00 mg/dL   GFR calc non Af Amer 86 >59 mL/min/1.73   GFR calc Af Amer 99 >59 mL/min/1.73   BUN/Creatinine Ratio 20 9 - 23   Sodium 144 134 - 144 mmol/L   Potassium 5.0 3.5 - 5.2 mmol/L   Chloride 108 (H) 96 - 106 mmol/L   CO2 24 20 - 29 mmol/L   Calcium 9.5 8.7 - 10.2 mg/dL   Total Protein 6.3 6.0 - 8.5 g/dL   Albumin 4.3 3.8 - 4.9 g/dL   Globulin, Total 2.0 1.5 - 4.5 g/dL   Albumin/Globulin Ratio 2.2 1.2 - 2.2   Bilirubin Total <0.2 0.0 - 1.2 mg/dL   Alkaline Phosphatase 75 44 - 121 IU/L   AST 16 0 - 40 IU/L   ALT 13 0 - 32 IU/L       Pertinent labs & imaging results that were available during my care of the  patient were reviewed by me and considered in my medical decision making.  Assessment & Plan:  Briana Nielsen was seen today for follow-up.  Diagnoses and all orders for this visit:  Herpes simplex infection of perianal skin Reports more frequent outbreaks lately. Has been taking Valtrex only as needed but would like to start suppressive therapy due to recurrent outbreaks. Will initiate 500 mg twice daily.  -     valACYclovir (VALTREX) 500 MG tablet; Take 1 tablet (500 mg total) by mouth 2 (two) times daily.  Labial cyst Labial cyst to left labia minora, hard, nontender, not fluctuant, no erythema or drainage. Will refer to GYN for evaluation and treatment.  -     Ambulatory referral to Obstetrics / Gynecology     Continue all other maintenance medications.  Follow up plan: Return if symptoms worsen or fail to improve.   Continue healthy lifestyle choices, including diet (rich in fruits, vegetables, and lean proteins, and low in salt and  simple carbohydrates) and exercise (at least 30 minutes of moderate physical activity daily).  Educational handout given for genital herpes  The above assessment and management plan was discussed with the patient. The patient verbalized understanding of and has agreed to the management plan. Patient is aware to call the clinic if they develop any new symptoms or if symptoms persist or worsen. Patient is aware when to return to the clinic for a follow-up visit. Patient educated on when it is appropriate to go to the emergency department.   Monia Pouch, FNP-C [facility] [phone]

## 2021-09-30 ENCOUNTER — Other Ambulatory Visit: Payer: Self-pay | Admitting: Family Medicine

## 2021-09-30 ENCOUNTER — Encounter: Payer: Self-pay | Admitting: Family Medicine

## 2021-09-30 DIAGNOSIS — G479 Sleep disorder, unspecified: Secondary | ICD-10-CM

## 2021-10-12 ENCOUNTER — Encounter: Payer: Self-pay | Admitting: Family Medicine

## 2021-10-20 ENCOUNTER — Other Ambulatory Visit: Payer: Self-pay | Admitting: Family Medicine

## 2021-10-20 ENCOUNTER — Encounter: Payer: Self-pay | Admitting: Family Medicine

## 2021-10-20 DIAGNOSIS — U071 COVID-19: Secondary | ICD-10-CM

## 2021-10-20 MED ORDER — BINAXNOW COVID-19 AG HOME TEST VI KIT
PACK | 0 refills | Status: DC
Start: 2021-10-20 — End: 2021-11-05

## 2021-10-29 ENCOUNTER — Telehealth (INDEPENDENT_AMBULATORY_CARE_PROVIDER_SITE_OTHER): Payer: 59 | Admitting: Family Medicine

## 2021-10-29 DIAGNOSIS — G4733 Obstructive sleep apnea (adult) (pediatric): Secondary | ICD-10-CM | POA: Diagnosis not present

## 2021-10-29 NOTE — Progress Notes (Signed)
MyChart Video visit  Subjective: CC:OSA PCP: Janora Norlander, DO ODQ:VHQIT Rotan is a 50 y.o. female. Patient provides verbal consent for consult held via video.  Due to COVID-19 pandemic this visit was conducted virtually. This visit type was conducted due to national recommendations for restrictions regarding the COVID-19 Pandemic (e.g. social distancing, sheltering in place) in an effort to limit this patient's exposure and mitigate transmission in our community. All issues noted in this document were discussed and addressed.  A physical exam was not performed with this format.   Location of patient: work Location of provider: [facility] Others present for call: none  1. OSA Patient with known obstructive sleep apnea that is not treated with CPAP machine.  She was diagnosed about 9 or 10 years ago.  She never got a CPAP machine due to financial constraints.  However, she has a surgery scheduled in March 2023 and they are requiring a new sleep study prior to scheduling her.  She admits to apneic spells, daytime sleepiness, snoring.   ROS: Per HPI  No Known Allergies Past Medical History:  Diagnosis Date   Arthritis    right knee   Herpes zoster    Menometrorrhagia    Ovarian cyst, left     Current Outpatient Medications:    COVID-19 At Home Antigen Test [facility] COVID-19 AG HOME TEST) KIT, Use as directed., Disp: 6 kit, Rfl: 0   metoprolol succinate (TOPROL-XL) 50 MG 24 hr tablet, Take 1 tablet (50 mg total) by mouth daily. Take with or immediately following a meal., Disp: 90 tablet, Rfl: 3   valACYclovir (VALTREX) 500 MG tablet, Take 1 tablet (500 mg total) by mouth 2 (two) times daily., Disp: 180 tablet, Rfl: 3  Gen: Nontoxic-appearing female  Assessment/ Plan: 50 y.o. female   OSA (obstructive sleep apnea) - Plan: Ambulatory referral to Sleep Studies  New referral placed to sleep studies.  Hopefully they can expedite her sleep study as she does have a surgery tentatively  planned but this will be postponed until she can have her polysomnogram.  Start time: 11:10a End time: 11:17am  Total time spent on patient care (including video visit/ documentation): 7 minutes  [place], [place] [phone]

## 2021-11-05 ENCOUNTER — Encounter: Payer: Self-pay | Admitting: Neurology

## 2021-11-05 ENCOUNTER — Ambulatory Visit (INDEPENDENT_AMBULATORY_CARE_PROVIDER_SITE_OTHER): Payer: 59 | Admitting: Neurology

## 2021-11-05 VITALS — BP 112/69 | HR 63 | Ht 67.0 in | Wt 194.5 lb

## 2021-11-05 DIAGNOSIS — I471 Supraventricular tachycardia, unspecified: Secondary | ICD-10-CM

## 2021-11-05 DIAGNOSIS — F17209 Nicotine dependence, unspecified, with unspecified nicotine-induced disorders: Secondary | ICD-10-CM | POA: Insufficient documentation

## 2021-11-05 DIAGNOSIS — Z2821 Immunization not carried out because of patient refusal: Secondary | ICD-10-CM

## 2021-11-05 DIAGNOSIS — S0990XS Unspecified injury of head, sequela: Secondary | ICD-10-CM

## 2021-11-05 DIAGNOSIS — R0683 Snoring: Secondary | ICD-10-CM | POA: Diagnosis not present

## 2021-11-05 DIAGNOSIS — H9193 Unspecified hearing loss, bilateral: Secondary | ICD-10-CM | POA: Diagnosis not present

## 2021-11-05 DIAGNOSIS — Z024 Encounter for examination for driving license: Secondary | ICD-10-CM
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
## 2021-11-05 NOTE — Patient Instructions (Signed)

## 2021-11-09 ENCOUNTER — Telehealth: Payer: Self-pay | Admitting: Neurology

## 2021-11-09 NOTE — Telephone Encounter (Signed)
LVM for pt to call me back to schedule sleep study  

## 2021-11-17 ENCOUNTER — Ambulatory Visit (INDEPENDENT_AMBULATORY_CARE_PROVIDER_SITE_OTHER): Payer: 59 | Admitting: Neurology

## 2021-11-17 DIAGNOSIS — G4733 Obstructive sleep apnea (adult) (pediatric): Secondary | ICD-10-CM | POA: Diagnosis not present

## 2021-11-17 DIAGNOSIS — S0990XS Unspecified injury of head, sequela: Secondary | ICD-10-CM

## 2021-11-17 DIAGNOSIS — I471 Supraventricular tachycardia: Secondary | ICD-10-CM

## 2021-11-17 DIAGNOSIS — Z024 Encounter for examination for driving license: Secondary | ICD-10-CM

## 2021-11-17 DIAGNOSIS — R0683 Snoring: Secondary | ICD-10-CM

## 2021-11-17 DIAGNOSIS — F17209 Nicotine dependence, unspecified, with unspecified nicotine-induced disorders: Secondary | ICD-10-CM

## 2021-11-17 DIAGNOSIS — H9193 Unspecified hearing loss, bilateral: Secondary | ICD-10-CM
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
## 2021-12-01 ENCOUNTER — Other Ambulatory Visit: Payer: Self-pay | Admitting: Cardiology

## 2021-12-01 NOTE — Addendum Note (Signed)
Addended by: Melvyn Novas on: 12/01/2021 05:49 PM   Modules accepted: Orders
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
## 2021-12-02 ENCOUNTER — Telehealth: Payer: Self-pay | Admitting: *Deleted

## 2021-12-02 ENCOUNTER — Telehealth: Payer: Self-pay | Admitting: Neurology

## 2021-12-02 NOTE — Telephone Encounter (Signed)
-----   Message from Carmen Dohmeier, MD sent at 12/01/2021  5:49 PM EST ----- °IMPRESSION:  This HST confirms that apnea is not present, neither is there hypoxia. Snoring was at times loud and accompanied 32% of sleep time.  °  °RECOMMENDATION: No significant OSA was documented in this HST.  °  °  °  °PS : This HST result does not correlate to this patient's clinical history of previously diagnosed OSA and loud snoring -with her mouth closed.  °I like to offer this patient a repeat study to be sure and at no cost to the patient.  °  °

## 2021-12-02 NOTE — Telephone Encounter (Signed)
Pt returned call and I was able to review the sleep study results. Advised that overall there was no significant sleep apnea present. Informed that she had about 39 min where she was on her back and that caused so mild apnea but overall nothing that appeared concerning.  Pt mentioned when she woke up there was a lead that had been off and so she was unsure how well the data picked up. She does not know when that would have popped off. Pt states that she also notes that her husband is concerned with the fact her snoring is so loud and he has witnessed the apneas. She states that he has mentioned that some nights she doesn't have any problems while other nights its very concerning.  Informed the patient that I will advise the sleep lab about her concerns and see if there is anything we can do from our end to further evaluate.

## 2021-12-02 NOTE — Telephone Encounter (Signed)
-----   Message from Melvyn Novas, MD sent at 12/01/2021  5:49 PM EST ----- IMPRESSION:  This HST confirms that apnea is not present, neither is there hypoxia. Snoring was at times loud and accompanied 32% of sleep time.   RECOMMENDATION: No significant OSA was documented in this HST.     PS : This HST result does not correlate to this patient's clinical history of previously diagnosed OSA and loud snoring -with her mouth closed.  I like to offer this patient a repeat study to be sure and at no cost to the patient.

## 2021-12-02 NOTE — Telephone Encounter (Signed)
Called patient to discuss sleep study results. No answer at this time. LVM for the patient to call back.   

## 2021-12-10 ENCOUNTER — Telehealth: Payer: Self-pay | Admitting: Neurology

## 2021-12-10 NOTE — Telephone Encounter (Signed)
Pt will call back to schedule her sleep study after her knee surgery.

## 2022-01-16 NOTE — Progress Notes (Signed)
Cardiology Office Note   Date:  01/18/2022   ID:  Briana Nielsen, DOB 01/07/1971, MRN [id number]  PCP:  Janora Norlander, DO  Cardiologist:   Minus Breeding, MD Referring:  Janora Norlander, DO  Chief Complaint  Patient presents with   Palpitations      History of Present Illness: Briana Nielsen is a 51 y.o. female who is referred by Janora Norlander, DO for evaluation of palpitations.  At the last visit she purchased online device and [employer].  She had multiple episodes of SVT.    She was treated with beta blocker and had improvement in her SVT.  She has not had any further episodes of SVT since she increase her beta-blocker.  She is going to have a knee replacement on the right side soon.  The patient denies any new symptoms such as chest discomfort, neck or arm discomfort. There has been no new shortness of breath, PND or orthopnea. There have been no reported palpitations, presyncope or syncope.    Past Medical History:  Diagnosis Date   Arthritis    right knee   Herpes zoster    Menometrorrhagia    Ovarian cyst, left     Past Surgical History:  Procedure Laterality Date   APPENDECTOMY     CESAREAN SECTION     ENDOMETRIAL BIOPSY  03/29/2016   negative   LAPAROSCOPIC OOPHERECTOMY  2000   TUBAL LIGATION     per OB/GYn report     Current Outpatient Medications  Medication Sig Dispense Refill   valACYclovir HCl (VALTREX PO) Take 1 tablet by mouth daily.     metoprolol succinate (TOPROL-XL) 50 MG 24 hr tablet Take 1 tablet (50 mg total) by mouth daily. Take with or immediately following a meal. 90 tablet 3   No current facility-administered medications for this visit.    Allergies:   Patient has no known allergies.    ROS:  Please see the history of present illness.   Otherwise, review of systems are positive for none.   All other systems are reviewed and negative.    PHYSICAL EXAM: VS:  BP (!) 146/84    Pulse (!) 53    Ht 5\' 7"  (1.702 m)    Wt 200  lb 9.6 oz (91 kg)    LMP 06/23/2017    SpO2 95%    BMI 31.42 kg/m  , BMI Body mass index is 31.42 kg/m. GENERAL:  Well appearing NECK:  No jugular venous distention, waveform within normal limits, carotid upstroke brisk and symmetric, no bruits, no thyromegaly LUNGS:  Clear to auscultation bilaterally CHEST:  Unremarkable HEART:  PMI not displaced or sustained,S1 and S2 within normal limits, no S3, no S4, no clicks, no rubs, no murmurs ABD:  Flat, positive bowel sounds normal in frequency in pitch, no bruits, no rebound, no guarding, no midline pulsatile mass, no hepatomegaly, no splenomegaly EXT:  2 plus pulses throughout, no edema, no cyanosis no clubbing   EKG:  EKG is  ordered today. The ekg ordered today demonstrates sinus rhythm, rate 53, axis within normal limits, intervals within normal limits, no acute ST-T wave changes.   Recent Labs: No results found for requested labs within last 8760 hours.    Lipid Panel    Component Value Date/Time   CHOL 158 12/22/2020 0824   TRIG 41 12/22/2020 0824   HDL 69 12/22/2020 0824   CHOLHDL 2.3 12/22/2020 0824   LDLCALC 80 12/22/2020 0824  Wt Readings from Last 3 Encounters:  01/18/22 200 lb 9.6 oz (91 kg)  11/05/21 194 lb 8 oz (88.2 kg)  08/04/21 203 lb (92.1 kg)      Other studies Reviewed: Additional studies/ records that were reviewed today include: None  Review of the above records demonstrates:  Please see elsewhere in the note.     ASSESSMENT AND PLAN:  PALPITATIONS:     She is having SVT.   She tolerates beta-blocker.  We talked about possibly stopping this in the future but I cannot guarantee her that she would not have more palpitations.  She thinks she will continue.  No change in therapy.   PREOP:   The patient is going a total knee replacement.  She is at acceptable risk for this without further cardiovascular testing according to ACC/AHA guidelines.  She has a high functional level and is not going for high  risk surgical procedure.  Current medicines are reviewed at length with the patient today.  The patient does not have concerns regarding medicines.  The following changes have been made:   None  Labs/ tests ordered today include: None  Orders Placed This Encounter  Procedures   EKG 12-Lead     Disposition:   FU with me 12 months.  Signed, Minus Breeding, MD  01/18/2022 1:12 PM    [facility]

## 2022-01-18 ENCOUNTER — Encounter: Payer: Self-pay | Admitting: Cardiology

## 2022-01-18 ENCOUNTER — Other Ambulatory Visit: Payer: Self-pay

## 2022-01-18 ENCOUNTER — Ambulatory Visit (INDEPENDENT_AMBULATORY_CARE_PROVIDER_SITE_OTHER): Payer: 59 | Admitting: Cardiology

## 2022-01-18 VITALS — BP 146/84 | HR 53 | Ht 67.0 in | Wt 200.6 lb

## 2022-01-18 DIAGNOSIS — R002 Palpitations: Secondary | ICD-10-CM

## 2022-01-18 MED ORDER — METOPROLOL SUCCINATE ER 50 MG PO TB24: 50.0000 mg | ORAL_TABLET | Freq: Every day | ORAL | 3 refills | Status: DC

## 2022-01-18 NOTE — Patient Instructions (Signed)

## 2022-01-21 ENCOUNTER — Telehealth: Payer: Self-pay

## 2022-01-21 NOTE — Telephone Encounter (Signed)
? ?  Pre-operative Risk Assessment  ?  ?Patient Name: Briana Nielsen  ?DOB: 08/19/1971 ?MRN: [id number]  ? ?  ? ?Request for Surgical Clearance   ? ?Procedure:   RIGHT KNEE ARTHROPLASTY  ? ?Date of Surgery:  Clearance 02/19/22                              ?   ?Surgeon:  Velna Ochs, MD ?Surgeon's Group or Practice Name:  [facility] ATTN:REBECCA LANG ?Phone number:  [phone] ?Fax number:  [phone] ?  ?Type of Clearance Requested:   ?- Medical  ?  ?Type of Anesthesia:  Spinal ?  ?Additional requests/questions:   ? ? ? ?

## 2022-01-21 NOTE — Telephone Encounter (Signed)
? ?  Primary Cardiologist: Minus Breeding, MD ? ?Chart reviewed as part of pre-operative protocol coverage. Given past medical history and time since last visit, based on ACC/AHA guidelines, Briana Nielsen would be at acceptable risk for the planned procedure without further cardiovascular testing.  ? ?Her RCRI is a class I risk, 0.4% risk of major cardiac event.  She is able to complete greater than 4 METS of physical activity. ? ?I will route this recommendation to the requesting party via Epic fax function and remove from pre-op pool. ? ?Please call with questions. ? ?Jossie Ng. Jacqulyn Barresi NP-C ? ?  ?01/21/2022, 2:28 PM ?[place] ?[address] ?Office [phone] Fax [phone] ? ? ? ? ?

## 2022-01-27 ENCOUNTER — Telehealth: Payer: Self-pay | Admitting: Cardiology

## 2022-01-27 NOTE — Telephone Encounter (Signed)
?*  STAT* If patient is at the pharmacy, call can be transferred to refill team. ? ? ?1. Which medications need to be refilled? (please list name of each medication and dose if known) metoprolol succinate (TOPROL-XL) 50 MG 24 hr tablet ? ?2. Which pharmacy/location (including street and city if local pharmacy) is medication to be sent to? [place], [address] ? ?3. Do they need a 30 day or 90 day supply? 90 ? ?Patient need for a prescription to go here ? ?

## 2022-01-28 ENCOUNTER — Other Ambulatory Visit: Payer: Self-pay

## 2022-01-28 MED ORDER — METOPROLOL SUCCINATE ER 50 MG PO TB24: 50.0000 mg | ORAL_TABLET | Freq: Every day | ORAL | 3 refills | Status: DC

## 2022-01-28 NOTE — Telephone Encounter (Signed)
Called patient to advise refill sent to pharmacy. °

## 2022-02-05 ENCOUNTER — Encounter: Payer: Self-pay | Admitting: Family Medicine

## 2022-02-05 ENCOUNTER — Ambulatory Visit (INDEPENDENT_AMBULATORY_CARE_PROVIDER_SITE_OTHER): Payer: 59 | Admitting: Family Medicine

## 2022-02-05 VITALS — BP 135/78 | HR 65 | Temp 98.0°F | Ht 67.0 in | Wt 197.4 lb

## 2022-02-05 DIAGNOSIS — I471 Supraventricular tachycardia: Secondary | ICD-10-CM | POA: Diagnosis not present

## 2022-02-05 DIAGNOSIS — Z1211 Encounter for screening for malignant neoplasm of colon: Secondary | ICD-10-CM | POA: Diagnosis not present

## 2022-02-05 DIAGNOSIS — Z01818 Encounter for other preprocedural examination: Secondary | ICD-10-CM

## 2022-02-05 DIAGNOSIS — Z0001 Encounter for general adult medical examination with abnormal findings: Secondary | ICD-10-CM | POA: Diagnosis not present

## 2022-02-05 DIAGNOSIS — Z0000 Encounter for general adult medical examination without abnormal findings: Secondary | ICD-10-CM

## 2022-02-05 LAB — BAYER DCA HB A1C WAIVED: HB A1C (BAYER DCA - WAIVED): 5.2 % (ref 4.8–5.6)

## 2022-02-05 NOTE — Progress Notes (Signed)
? ?Briana Nielsen is a 51 y.o. female presents to office today for annual physical exam examination.   ? ?Concerns today include: ?1.  Preop clearance form needs to be completed.  Plan is to have a right knee arthroscopy scheduled March 31 of this year.  She has seen her cardiologist and had cardiac clearance through them.  Needs labs collected.  She will be having a spinal for anesthesia.  Really optimistic about getting back into physical activity/exercise after her right knee surgery. ? ?Occupation: Seated work, Marital status: Married, Substance use: Smokes tobacco, currently in the action phase of cessation prior to her surgery ?Diet: Balanced, Exercise: No structured ?Last eye exam: Up-to-date ?Last colonoscopy: Needs ?Last mammogram: Up-to-date ?Last pap smear: Up-to-date ?Refills needed today: None ?Immunizations needed: Declines vaccination ? ?There is no immunization history on file for this patient. ? ? ?Past Medical History:  ?Diagnosis Date  ? Arthritis   ? right knee  ? Herpes zoster   ? Menometrorrhagia   ? Ovarian cyst, left   ? ?Social History  ? ?Socioeconomic History  ? Marital status: Married  ?  Spouse name: Elberta Fortis  ? Number of children: 2  ? Years of education: Not on file  ? Highest education level: Associate degree: occupational, Hotel manager, or vocational program  ?Occupational History  ? Not on file  ?Tobacco Use  ? Smoking status: Former  ?  Types: E-cigarettes  ?  Quit date: 02/22/2012  ?  Years since quitting: 9.9  ? Smokeless tobacco: Never  ?Vaping Use  ? Vaping Use: Every day  ?Substance and Sexual Activity  ? Alcohol use: No  ? Drug use: Yes  ?  Types: Marijuana  ?  Comment: every now & then  ? Sexual activity: Yes  ?  Birth control/protection: Post-menopausal  ?Other Topics Concern  ? Not on file  ?Social History Narrative  ? Lives with husband and 79 year old son  ? Right handed  ? Caffeine: 3 cups of coffee a day  ? ?Social Determinants of Health  ? ?Financial Resource Strain: Not on  file  ?Food Insecurity: Not on file  ?Transportation Needs: Not on file  ?Physical Activity: Not on file  ?Stress: Not on file  ?Social Connections: Not on file  ?Intimate Partner Violence: Not on file  ? ?Past Surgical History:  ?Procedure Laterality Date  ? APPENDECTOMY    ? CESAREAN SECTION    ? ENDOMETRIAL BIOPSY  03/29/2016  ? negative  ? LAPAROSCOPIC OOPHERECTOMY  2000  ? TUBAL LIGATION    ? per OB/GYn report  ? ?Family History  ?Problem Relation Age of Onset  ? Cancer Mother   ?     anal  ? Dermatomyositis Mother   ? Mental illness Sister   ? Drug abuse Sister   ? Breast cancer Maternal Grandmother 60  ? Heart attack Father   ? Pancreatic cancer Maternal Aunt   ? ? ?Current Outpatient Medications:  ?  metoprolol succinate (TOPROL-XL) 50 MG 24 hr tablet, Take 1 tablet (50 mg total) by mouth daily. Take with or immediately following a meal., Disp: 90 tablet, Rfl: 3 ?  valACYclovir HCl (VALTREX PO), Take 1 tablet by mouth daily., Disp: , Rfl:  ? ?No Known Allergies  ? ?ROS: ?Review of Systems ?A comprehensive review of systems was negative except for: Eyes: positive for contacts/glasses ?Ears, nose, mouth, throat, and face: positive for snoring ?Respiratory: positive for snoring ?Gastrointestinal: positive for change in bowel habits ?Musculoskeletal: positive  for right knee pain ?Behavioral/Psych: positive for work stress   ? ?Physical exam ?BP 135/78   Pulse 65   Temp 98 ?F (36.7 ?C) (Oral)   Ht '5\' 7"'  (1.702 m)   Wt 197 lb 6.4 oz (89.5 kg)   LMP 06/23/2017   SpO2 98%   BMI 30.92 kg/m?  ?General appearance: alert, cooperative, appears stated age, and no distress ?Head: Normocephalic, without obvious abnormality, atraumatic ?Eyes: negative findings: lids and lashes normal, conjunctivae and sclerae normal, corneas clear, and pupils equal, round, reactive to light and accomodation ?Ears: normal TM's and external ear canals both ears ?Nose:  Septum is somewhat irregular.  Nasal turbinates without edema or  erythema.  No significant drainage ?Throat:  Mallampati 2.  Oropharynx without masses or erythema.  No sublingual masses ?Neck: no adenopathy, supple, symmetrical, trachea midline, and thyroid not enlarged, symmetric, no tenderness/mass/nodules ?Back: symmetric, no curvature. ROM normal. No CVA tenderness. ?Lungs: clear to auscultation bilaterally ?Heart: regular rate and rhythm, S1, S2 normal, no murmur, click, rub or gallop ?Abdomen: soft, non-tender; bowel sounds normal; no masses,  no organomegaly ?Extremities: extremities normal, atraumatic, no cyanosis or edema ?Pulses: 2+ and symmetric ?Skin: Skin color, texture, turgor normal. No rashes or lesions ?Lymph nodes: Cervical, supraclavicular, and axillary nodes normal. ?Neurologic: Grossly normal ? ?Assessment/ Plan: ?Briana Nielsen here for annual physical exam.  ? ?Annual physical exam ? ?Preoperative examination - Plan: CBC with Differential/Platelet, CMP14+EGFR, Bayer DCA Hb A1c Waived, Protime-INR, Lipid panel ? ?SVT (supraventricular tachycardia) ([place]) ? ?Colon cancer screening - Plan: Ambulatory referral to Gastroenterology ? ?Needs vaccinations but has refused in the past.  We will get her set up for colon cancer screening.  Referral back to [facility] gastroenterology is placed. ? ?She is considered low risk for an intermediate risk procedure.  Modifiable risk factors include smoking and she is going to be abstaining from smoking for at least 2 weeks prior to surgery and hopefully stop totally thereafter.  She is motivated to quit.  RCRI is 0. ? ?Apparently previously diagnoses obstructive sleep apnea but had her sleep study updated and that was determined to be an accurate.  She does not have obstructive sleep apnea nor does she require any special treatment but they did recommend that she have her nasal septum evaluated by ENT at some point as this was thought to be the cause of her snoring. ? ?SVT has been stable and she is been cleared from a cardiac  standpoint by her cardiologist.  Their notes are available in the EMR ? ?Patient to follow up in 1 year for annual exam or sooner if needed. ? ?Briana Nielsen M. Gardenia Witter, DO ? ? ? ? ? ? ? ? ? ? ? ? ? ? ?

## 2022-02-06 LAB — CBC WITH DIFFERENTIAL/PLATELET
Basophils Absolute: 0.1 10*3/uL (ref 0.0–0.2)
Basos: 1 %
EOS (ABSOLUTE): 0.3 10*3/uL (ref 0.0–0.4)
Eos: 4 %
Hematocrit: 40.8 % (ref 34.0–46.6)
Hemoglobin: 13.9 g/dL (ref 11.1–15.9)
Immature Grans (Abs): 0 10*3/uL (ref 0.0–0.1)
Immature Granulocytes: 0 %
Lymphocytes Absolute: 2.7 10*3/uL (ref 0.7–3.1)
Lymphs: 35 %
MCH: 32.1 pg (ref 26.6–33.0)
MCHC: 34.1 g/dL (ref 31.5–35.7)
MCV: 94 fL (ref 79–97)
Monocytes Absolute: 0.7 10*3/uL (ref 0.1–0.9)
Monocytes: 9 %
Neutrophils Absolute: 3.9 10*3/uL (ref 1.4–7.0)
Neutrophils: 51 %
Platelets: 297 10*3/uL (ref 150–450)
RBC: 4.33 x10E6/uL (ref 3.77–5.28)
RDW: 12.6 % (ref 11.7–15.4)
WBC: 7.7 10*3/uL (ref 3.4–10.8)

## 2022-02-06 LAB — CMP14+EGFR
ALT: 15 IU/L (ref 0–32)
AST: 18 IU/L (ref 0–40)
Albumin/Globulin Ratio: 1.8 (ref 1.2–2.2)
Albumin: 4.3 g/dL (ref 3.8–4.9)
Alkaline Phosphatase: 78 IU/L (ref 44–121)
BUN/Creatinine Ratio: 16 (ref 9–23)
BUN: 12 mg/dL (ref 6–24)
Bilirubin Total: 0.3 mg/dL (ref 0.0–1.2)
CO2: 26 mmol/L (ref 20–29)
Calcium: 10 mg/dL (ref 8.7–10.2)
Chloride: 109 mmol/L — ABNORMAL HIGH (ref 96–106)
Creatinine, Ser: 0.77 mg/dL (ref 0.57–1.00)
Globulin, Total: 2.4 g/dL (ref 1.5–4.5)
Glucose: 94 mg/dL (ref 70–99)
Potassium: 5 mmol/L (ref 3.5–5.2)
Sodium: 147 mmol/L — ABNORMAL HIGH (ref 134–144)
Total Protein: 6.7 g/dL (ref 6.0–8.5)
eGFR: 93 mL/min/{1.73_m2} (ref >59)

## 2022-02-06 LAB — LIPID PANEL
Chol/HDL Ratio: 2.3 ratio (ref 0.0–4.4)
Cholesterol, Total: 170 mg/dL (ref 100–199)
HDL: 74 mg/dL (ref >39)
LDL Chol Calc (NIH): 87 mg/dL (ref 0–99)
Triglycerides: 45 mg/dL (ref 0–149)
VLDL Cholesterol Cal: 9 mg/dL (ref 5–40)

## 2022-02-06 LAB — PROTIME-INR
INR: 0.9 (ref 0.9–1.2)
Prothrombin Time: 9.8 s (ref 9.1–12.0)

## 2022-02-15 ENCOUNTER — Encounter: Payer: 59 | Admitting: Family Medicine

## 2022-11-04 ENCOUNTER — Other Ambulatory Visit: Payer: Self-pay | Admitting: Family Medicine

## 2022-11-04 DIAGNOSIS — A601 Herpesviral infection of perianal skin and rectum: Secondary | ICD-10-CM

## 2023-01-10 ENCOUNTER — Other Ambulatory Visit: Payer: Self-pay | Admitting: Family Medicine

## 2023-01-10 DIAGNOSIS — A601 Herpesviral infection of perianal skin and rectum: Secondary | ICD-10-CM

## 2023-01-10 MED ORDER — VALACYCLOVIR HCL 500 MG PO TABS: ORAL_TABLET | ORAL | 3 refills | Status: DC

## 2023-02-13 ENCOUNTER — Other Ambulatory Visit: Payer: Self-pay | Admitting: Cardiology

## 2023-03-18 ENCOUNTER — Other Ambulatory Visit: Payer: Self-pay | Admitting: Cardiology

## 2023-04-16 ENCOUNTER — Other Ambulatory Visit: Payer: Self-pay | Admitting: Cardiology

## 2023-06-06 ENCOUNTER — Encounter: Payer: Self-pay | Admitting: Family Medicine

## 2023-06-06 ENCOUNTER — Ambulatory Visit (INDEPENDENT_AMBULATORY_CARE_PROVIDER_SITE_OTHER): Payer: 59 | Admitting: Family Medicine

## 2023-06-06 VITALS — BP 124/73 | HR 60 | Temp 98.6°F | Ht 67.0 in | Wt 212.0 lb

## 2023-06-06 DIAGNOSIS — R29898 Other symptoms and signs involving the musculoskeletal system: Secondary | ICD-10-CM

## 2023-06-06 DIAGNOSIS — Z23 Encounter for immunization: Secondary | ICD-10-CM

## 2023-06-06 DIAGNOSIS — L989 Disorder of the skin and subcutaneous tissue, unspecified: Secondary | ICD-10-CM

## 2023-06-06 DIAGNOSIS — Z8269 Family history of other diseases of the musculoskeletal system and connective tissue: Secondary | ICD-10-CM

## 2023-06-06 DIAGNOSIS — K582 Mixed irritable bowel syndrome: Secondary | ICD-10-CM | POA: Diagnosis not present

## 2023-06-06 DIAGNOSIS — F17209 Nicotine dependence, unspecified, with unspecified nicotine-induced disorders: Secondary | ICD-10-CM | POA: Diagnosis not present

## 2023-06-06 DIAGNOSIS — Z800 Family history of malignant neoplasm of digestive organs: Secondary | ICD-10-CM

## 2023-06-06 DIAGNOSIS — Z6833 Body mass index (BMI) 33.0-33.9, adult: Secondary | ICD-10-CM

## 2023-06-06 DIAGNOSIS — Z1159 Encounter for screening for other viral diseases: Secondary | ICD-10-CM

## 2023-06-06 DIAGNOSIS — Z0001 Encounter for general adult medical examination with abnormal findings: Secondary | ICD-10-CM | POA: Diagnosis not present

## 2023-06-06 DIAGNOSIS — R5382 Chronic fatigue, unspecified: Secondary | ICD-10-CM

## 2023-06-06 DIAGNOSIS — Z8679 Personal history of other diseases of the circulatory system: Secondary | ICD-10-CM

## 2023-06-06 DIAGNOSIS — Z0000 Encounter for general adult medical examination without abnormal findings: Secondary | ICD-10-CM

## 2023-06-06 DIAGNOSIS — A601 Herpesviral infection of perianal skin and rectum: Secondary | ICD-10-CM

## 2023-06-06 MED ORDER — METOPROLOL SUCCINATE ER 50 MG PO TB24
50.0000 mg | ORAL_TABLET | Freq: Every day | ORAL | 3 refills | Status: DC
Start: 2023-06-06 — End: 2023-07-01

## 2023-06-06 MED ORDER — VALACYCLOVIR HCL 500 MG PO TABS
ORAL_TABLET | ORAL | 3 refills | Status: DC
Start: 2023-06-06 — End: 2023-07-01

## 2023-06-06 NOTE — Patient Instructions (Addendum)
[phone] [place] [facility] Dr Michiel Sites

## 2023-06-06 NOTE — Progress Notes (Signed)
Briana Nielsen is a 52 y.o. female presents to office today for annual physical exam examination.    Concerns today include: 1.  Left shoulder weakness Patient reports that she has been having some left shoulder weakness.  This is only been going on for the last couple of months but she voices concern because her mother, who suffered from dermatomyositis, presented similarly prior to her diagnosis.  She denies any preceding injury or sensory changes.  She also has several lesions along the abdomen and her back of neck that she would like to have evaluated by dermatology.  Reports no spontaneous bleeding but feels like the one on the back of the neck may be getting larger  2.  Family history of rectal cancer, personal history of irritable bowel Patient reports that she has constipation alternating with diarrhea.  She reports no blood in stool.  There is a family history of rectal cancer in her mom.  She was previously evaluated by [facility] and needs referral back to their office for colonoscopy.  She reports compliance with high-fiber diet, use of cam boot chest, pre and probiotics as well as multivitamins but nothing really helps GI symptoms.  3.  Chronic fatigue She reports chronic fatigue of uncertain etiology.  She reports no snoring, pauses in breathing.  She reports no excessive vaginal bleeding, unplanned weight loss, hemoptysis, night sweats.  She continues to smoke regularly both tobacco and cannabis.  Occupation: works in [employer], Substance use: tobacco,THC Diet: high fiber, Exercise: no structured reported Last eye exam: UTD Last dental exam: UTD Last colonoscopy: needs Last mammogram: needs Last pap smear: needs Refills needed today: all Immunizations needed: tetanus needed There is no immunization history for the selected administration types on file for this patient.   Past Medical History:  Diagnosis Date   Arthritis    right knee   Herpes zoster    Menometrorrhagia     Ovarian cyst, left    Social History   Socioeconomic History   Marital status: Married    Spouse name: Ethelene Browns   Number of children: 2   Years of education: Not on file   Highest education level: Associate degree: occupational, Scientist, product/process development, or vocational program  Occupational History   Not on file  Tobacco Use   Smoking status: Former    Types: E-cigarettes    Quit date: 02/22/2012    Years since quitting: 11.2   Smokeless tobacco: Never  Vaping Use   Vaping status: Every Day  Substance and Sexual Activity   Alcohol use: No   Drug use: Yes    Types: Marijuana    Comment: every now & then   Sexual activity: Yes    Birth control/protection: Post-menopausal  Other Topics Concern   Not on file  Social History Narrative   Lives with husband and 58 year old son   Right handed   Caffeine: 3 cups of coffee a day   Social Determinants of Health   Financial Resource Strain: Not on file  Food Insecurity: Not on file  Transportation Needs: Not on file  Physical Activity: Not on file  Stress: Not on file  Social Connections: Unknown (04/04/2022)   Received from [facility], [facility]   Social Network    Social Network: Not on file  Intimate Partner Violence: Unknown (02/24/2022)   Received from [facility], [facility]   HITS    Physically Hurt: Not on file    Insult or Talk Down To: Not on  file    Threaten Physical Harm: Not on file    Scream or Curse: Not on file   Past Surgical History:  Procedure Laterality Date   APPENDECTOMY     CESAREAN SECTION     ENDOMETRIAL BIOPSY  03/29/2016   negative   LAPAROSCOPIC OOPHERECTOMY  2000   TUBAL LIGATION     per OB/GYn report   Family History  Problem Relation Age of Onset   Cancer Mother        anal   Dermatomyositis Mother    Mental illness Sister    Drug abuse Sister    Breast cancer Maternal Grandmother 60   Heart attack Father    Pancreatic cancer Maternal Aunt     Current Outpatient Medications:     metoprolol succinate (TOPROL-XL) 50 MG 24 hr tablet, Take 1 tablet (50 mg total) by mouth daily. Take with or immediately following a meal., Disp: 180 tablet, Rfl: 3   valACYclovir (VALTREX) 500 MG tablet, Take 1 tablet daily for suppression.  May increased to 4 tablets (2g) twice daily x1 day per breakthrough flare., Disp: 180 tablet, Rfl: 3  No Known Allergies   ROS: Review of Systems A comprehensive review of systems was negative except for: Eyes: positive for contacts/glasses Gastrointestinal: positive for constipation and diarrhea Integument/breast: positive for skin lesion(s) Musculoskeletal: positive for left shoulder weakness    Physical exam BP 124/73   Pulse 60   Temp 98.6 F (37 C)   Ht 5\' 7"  (1.702 m)   Wt 212 lb (96.2 kg)   LMP 06/23/2017   SpO2 96%   BMI 33.20 kg/m  General appearance: alert, cooperative, appears stated age, no distress, and mildly obese Head: Normocephalic, without obvious abnormality, atraumatic Eyes: negative findings: lids and lashes normal, conjunctivae and sclerae normal, corneas clear, and pupils equal, round, reactive to light and accomodation Ears: normal TM's and external ear canals both ears Nose: Nares normal. Septum midline. Mucosa normal. No drainage or sinus tenderness. Throat:  Poor dentition.  Oropharynx without erythema, masses.  No sublingual masses appreciated Neck: no adenopathy, no carotid bruit, supple, symmetrical, trachea midline, and thyroid not enlarged, symmetric, no tenderness/mass/nodules Back: symmetric, no curvature. ROM normal. No CVA tenderness. Lungs: clear to auscultation bilaterally Heart: regular rate and rhythm, S1, S2 normal, no murmur, click, rub or gallop Abdomen: soft, non-tender; bowel sounds normal; no masses,  no organomegaly Extremities: extremities normal, atraumatic, no cyanosis or edema and well-healed postsurgical scar noted vertically along the right knee anteriorly Pulses: 2+ and symmetric Skin:   She has raised, papular like lesion along the upper mid abdomen.  Several pigmented nevi noted Lymph nodes: Cervical, supraclavicular, and axillary nodes normal. Neurologic: Grossly normal Psych: Mood stable, speech normal, affect appropriate  Assessment/ Plan: Samyukta Cura here for annual physical exam.   Annual physical exam - Plan: Tdap vaccine greater than or equal to 7yo IM  BMI 33.0-33.9,adult - Plan: Lipid Panel, Bayer DCA Hb A1c Waived  Tobacco use disorder, continuous - Plan: CBC  Encounter for hepatitis C screening test for low risk patient - Plan: Hepatitis C antibody  Family history of cancer of GI tract  Irritable bowel syndrome with both constipation and diarrhea - Plan: Ambulatory referral to Gastroenterology  History of paroxysmal supraventricular tachycardia - Plan: CMP14+EGFR, TSH, metoprolol succinate (TOPROL-XL) 50 MG 24 hr tablet  Herpes simplex infection of perianal skin - Plan: valACYclovir (VALTREX) 500 MG tablet  Chronic fatigue - Plan: Vitamin B12, VITAMIN D 25 Hydroxy (  Vit-D Deficiency, Fractures), T4, free, Iron, TIBC and Ferritin Panel, C-reactive protein, Sedimentation Rate, CK, Aldolase  Family history of dermatomyositis - Plan: C-reactive protein, Sedimentation Rate, CK, Aldolase, Ambulatory referral to Dermatology  Shoulder weakness - Plan: C-reactive protein, Sedimentation Rate, CK, Aldolase  Skin lesions - Plan: Ambulatory referral to Dermatology  Referral back to [facility] gastroenterology placed for ongoing surveillance for colon cancer given family history of rectal cancer.  Hopefully they can address her irritable bowel type symptoms as well.  She will set up mobile mammogram appt at discharge today.  She will schedule Pap smear with OB/GYN  Check CBC given ongoing tobacco use, fasting labs given obese BMI  Toprol renewed.  Her heart rate was regular and slightly bradycardic on exam today.  Check thyroid level, electrolytes  Valtrex renewed for  HSV.  Symptoms are chronic and stable  Given chronic fatigue, family history of dermatomyositis will evaluate for possible autoimmune component and labs have been ordered.  Referral to dermatology for skin lesions noted and they can consider biopsy if they have any concerning findings.  I did not appreciate any facial rashes on exam today.  Shoulder weakness may be due to undiagnosed rotator cuff issue but will rule out autoimmune causes given family history  Counseled on healthy lifestyle choices, including diet (rich in fruits, vegetables and lean meats and low in salt and simple carbohydrates) and exercise (at least 30 minutes of moderate physical activity daily).  Patient to follow up in 1 year for annual exam or sooner if needed.  Nayelis Bonito M. Nadine Counts, DO

## 2023-06-07 ENCOUNTER — Other Ambulatory Visit: Payer: 59

## 2023-06-07 DIAGNOSIS — Z8269 Family history of other diseases of the musculoskeletal system and connective tissue: Secondary | ICD-10-CM

## 2023-06-07 DIAGNOSIS — F17209 Nicotine dependence, unspecified, with unspecified nicotine-induced disorders: Secondary | ICD-10-CM

## 2023-06-07 DIAGNOSIS — R29898 Other symptoms and signs involving the musculoskeletal system: Secondary | ICD-10-CM

## 2023-06-07 DIAGNOSIS — Z8679 Personal history of other diseases of the circulatory system: Secondary | ICD-10-CM

## 2023-06-07 DIAGNOSIS — R5382 Chronic fatigue, unspecified: Secondary | ICD-10-CM

## 2023-06-07 DIAGNOSIS — Z1159 Encounter for screening for other viral diseases: Secondary | ICD-10-CM

## 2023-06-07 DIAGNOSIS — Z6833 Body mass index (BMI) 33.0-33.9, adult: Secondary | ICD-10-CM

## 2023-06-07 LAB — BAYER DCA HB A1C WAIVED: HB A1C (BAYER DCA - WAIVED): 5.2 % (ref 4.8–5.6)

## 2023-06-09 LAB — CK: Total CK: 96 U/L (ref 32–182)

## 2023-06-09 LAB — CMP14+EGFR
ALT: 13 IU/L (ref 0–32)
AST: 14 IU/L (ref 0–40)
Albumin: 4.2 g/dL (ref 3.8–4.9)
Alkaline Phosphatase: 86 IU/L (ref 44–121)
BUN/Creatinine Ratio: 15 (ref 9–23)
BUN: 12 mg/dL (ref 6–24)
Bilirubin Total: 0.4 mg/dL (ref 0.0–1.2)
CO2: 24 mmol/L (ref 20–29)
Calcium: 9.1 mg/dL (ref 8.7–10.2)
Chloride: 104 mmol/L (ref 96–106)
Creatinine, Ser: 0.79 mg/dL (ref 0.57–1.00)
Globulin, Total: 2 g/dL (ref 1.5–4.5)
Glucose: 89 mg/dL (ref 70–99)
Potassium: 4.4 mmol/L (ref 3.5–5.2)
Sodium: 141 mmol/L (ref 134–144)
Total Protein: 6.2 g/dL (ref 6.0–8.5)
eGFR: 89 mL/min/{1.73_m2} (ref >59)

## 2023-06-09 LAB — IRON,TIBC AND FERRITIN PANEL
Ferritin: 68 ng/mL (ref 15–150)
Iron Saturation: 34 % (ref 15–55)
Iron: 105 ug/dL (ref 27–159)
Total Iron Binding Capacity: 309 ug/dL (ref 250–450)
UIBC: 204 ug/dL (ref 131–425)

## 2023-06-09 LAB — LIPID PANEL
Chol/HDL Ratio: 2.6 ratio (ref 0.0–4.4)
Cholesterol, Total: 167 mg/dL (ref 100–199)
HDL: 64 mg/dL (ref >39)
LDL Chol Calc (NIH): 91 mg/dL (ref 0–99)
Triglycerides: 59 mg/dL (ref 0–149)
VLDL Cholesterol Cal: 12 mg/dL (ref 5–40)

## 2023-06-09 LAB — HEPATITIS C ANTIBODY: Hep C Virus Ab: NONREACTIVE

## 2023-06-09 LAB — VITAMIN B12: Vitamin B-12: 1056 pg/mL (ref 232–1245)

## 2023-06-09 LAB — CBC
Hematocrit: 41.2 % (ref 34.0–46.6)
Hemoglobin: 14 g/dL (ref 11.1–15.9)
MCH: 32.6 pg (ref 26.6–33.0)
MCHC: 34 g/dL (ref 31.5–35.7)
MCV: 96 fL (ref 79–97)
Platelets: 232 10*3/uL (ref 150–450)
RBC: 4.29 x10E6/uL (ref 3.77–5.28)
RDW: 12.9 % (ref 11.7–15.4)
WBC: 9 10*3/uL (ref 3.4–10.8)

## 2023-06-09 LAB — T4, FREE: Free T4: 1.42 ng/dL (ref 0.82–1.77)

## 2023-06-09 LAB — TSH: TSH: 1.74 u[IU]/mL (ref 0.450–4.500)

## 2023-06-09 LAB — ALDOLASE: Aldolase: 6 U/L (ref 3.3–10.3)

## 2023-06-09 LAB — C-REACTIVE PROTEIN: CRP: 2 mg/L (ref 0–10)

## 2023-06-09 LAB — VITAMIN D 25 HYDROXY (VIT D DEFICIENCY, FRACTURES): Vit D, 25-Hydroxy: 49.9 ng/mL (ref 30.0–100.0)

## 2023-06-09 LAB — SEDIMENTATION RATE: Sed Rate: 2 mm/hr (ref 0–40)

## 2023-06-10 ENCOUNTER — Other Ambulatory Visit: Payer: Self-pay | Admitting: Family Medicine

## 2023-06-10 DIAGNOSIS — Z1231 Encounter for screening mammogram for malignant neoplasm of breast: Secondary | ICD-10-CM

## 2023-06-11 ENCOUNTER — Other Ambulatory Visit: Payer: Self-pay | Admitting: Cardiology

## 2023-06-11 DIAGNOSIS — Z8679 Personal history of other diseases of the circulatory system: Secondary | ICD-10-CM

## 2023-06-15 ENCOUNTER — Ambulatory Visit
Admission: RE | Admit: 2023-06-15 | Discharge: 2023-06-15 | Disposition: A | Discharge status: Home / Self Care | Payer: 59 | Source: Ambulatory Visit | Attending: Family Medicine | Admitting: Family Medicine

## 2023-06-15 DIAGNOSIS — Z1231 Encounter for screening mammogram for malignant neoplasm of breast: Secondary | ICD-10-CM

## 2023-06-30 ENCOUNTER — Encounter: Payer: Self-pay | Admitting: Family Medicine

## 2023-07-01 ENCOUNTER — Other Ambulatory Visit: Payer: Self-pay

## 2023-07-01 DIAGNOSIS — A601 Herpesviral infection of perianal skin and rectum: Secondary | ICD-10-CM

## 2023-07-01 DIAGNOSIS — Z8679 Personal history of other diseases of the circulatory system: Secondary | ICD-10-CM

## 2023-07-01 MED ORDER — METOPROLOL SUCCINATE ER 50 MG PO TB24
50.0000 mg | ORAL_TABLET | Freq: Every day | ORAL | 3 refills | Status: DC
Start: 2023-07-01 — End: 2024-08-10

## 2023-07-01 MED ORDER — VALACYCLOVIR HCL 500 MG PO TABS
ORAL_TABLET | ORAL | 3 refills | Status: DC
Start: 2023-07-01 — End: 2024-08-10

## 2023-11-02 ENCOUNTER — Other Ambulatory Visit (HOSPITAL_COMMUNITY): Payer: Self-pay | Admitting: Medical

## 2023-11-02 ENCOUNTER — Ambulatory Visit (HOSPITAL_COMMUNITY): Admission: RE | Admit: 2023-11-02 | Payer: 59 | Source: Ambulatory Visit

## 2023-11-02 DIAGNOSIS — M79662 Pain in left lower leg: Secondary | ICD-10-CM

## 2023-11-03 ENCOUNTER — Ambulatory Visit (HOSPITAL_COMMUNITY)
Admission: RE | Admit: 2023-11-03 | Discharge: 2023-11-03 | Disposition: A | Discharge status: Home / Self Care | Payer: 59 | Source: Ambulatory Visit | Attending: Medical | Admitting: Medical

## 2023-11-03 DIAGNOSIS — M79662 Pain in left lower leg: Secondary | ICD-10-CM | POA: Insufficient documentation

## 2023-12-07 ENCOUNTER — Ambulatory Visit: Payer: 59 | Admitting: Family Medicine

## 2023-12-27 ENCOUNTER — Encounter: Payer: Self-pay | Admitting: Dermatology

## 2023-12-27 ENCOUNTER — Ambulatory Visit: Payer: 59 | Admitting: Dermatology

## 2023-12-27 VITALS — BP 128/70 | HR 78

## 2023-12-27 DIAGNOSIS — D492 Neoplasm of unspecified behavior of bone, soft tissue, and skin: Secondary | ICD-10-CM | POA: Diagnosis not present

## 2023-12-27 DIAGNOSIS — D229 Melanocytic nevi, unspecified: Secondary | ICD-10-CM

## 2023-12-27 DIAGNOSIS — D3617 Benign neoplasm of peripheral nerves and autonomic nervous system of trunk, unspecified: Secondary | ICD-10-CM

## 2023-12-27 DIAGNOSIS — Z1283 Encounter for screening for malignant neoplasm of skin: Secondary | ICD-10-CM | POA: Diagnosis not present

## 2023-12-27 DIAGNOSIS — W908XXA Exposure to other nonionizing radiation, initial encounter: Secondary | ICD-10-CM

## 2023-12-27 DIAGNOSIS — L814 Other melanin hyperpigmentation: Secondary | ICD-10-CM

## 2023-12-27 DIAGNOSIS — D225 Melanocytic nevi of trunk: Secondary | ICD-10-CM | POA: Diagnosis not present

## 2023-12-27 DIAGNOSIS — D1801 Hemangioma of skin and subcutaneous tissue: Secondary | ICD-10-CM

## 2023-12-27 DIAGNOSIS — L821 Other seborrheic keratosis: Secondary | ICD-10-CM | POA: Diagnosis not present

## 2023-12-27 DIAGNOSIS — L578 Other skin changes due to chronic exposure to nonionizing radiation: Secondary | ICD-10-CM

## 2023-12-27 NOTE — Progress Notes (Signed)
   New Patient Visit   Subjective  Briana Nielsen is a 53 y.o. female who presents for the following: Skin Cancer Screening and Upper Body Skin Exam  The patient presents for Upper Body Skin Exam (UBSE) for skin cancer screening and mole check. The patient has spots, moles and lesions to be evaluated, some may be new or changing. Pt has no hx of skin cancer nor family hx of skin cancer. Family history of breast cancer, pancreatic cancer, and anorectal cancer.  The following portions of the chart were reviewed this encounter and updated as appropriate: medications, allergies, medical history  Review of Systems:  No other skin or systemic complaints except as noted in HPI or Assessment and Plan.  Objective  Well appearing patient in no apparent distress; mood and affect are within normal limits.  All skin waist up examined. Relevant physical exam findings are noted in the Assessment and Plan.  Left Lower Back 1.2 cm Flesh colored pedunculated papule  Right Lower Back 0.9 cm Flesh colored papule   Assessment & Plan   Actinic Damage - Chronic condition, secondary to cumulative UV/sun exposure - diffuse scaly erythematous macules with underlying dyspigmentation - Recommend daily broad spectrum sunscreen SPF 30+ to sun-exposed areas, reapply every 2 hours as needed.  - Staying in the shade or wearing long sleeves, sun glasses (UVA+UVB protection) and wide brim hats (4-inch brim around the entire circumference of the hat) are also recommended for sun protection.  - Call for new or changing lesions.  Melanocytic Nevi - Tan-brown and/or pink-flesh-colored symmetric macules and papules - Benign appearing on exam today - Observation - Call clinic for new or changing moles - Recommend daily use of broad spectrum spf 30+ sunscreen to sun-exposed areas.   SEBORRHEIC KERATOSIS - Stuck-on, waxy, tan-brown papules and/or plaques  - Benign-appearing - Discussed benign etiology and  prognosis. - Observe - Call for any changes  LENTIGINES Exam: scattered tan macules Due to sun exposure Treatment Plan: Benign-appearing, observe. Recommend daily broad spectrum sunscreen SPF 30+ to sun-exposed areas, reapply every 2 hours as needed.  Call for any changes    HEMANGIOMA Exam: red papule(s) Discussed benign nature. Recommend observation. Call for changes.   Neoplasm of Skin x 2- The patient underwent shave removal of lesions at multiple locations for diagnostic and/or therapeutic purposes. The treatment areas were cleansed with an antiseptic solution, and local anesthesia was administered using 1% lidocaine with epinephrine for patient comfort. Using a sterile blade, lesions were carefully shaved at the level of the dermis, ensuring adequate removal while minimizing tissue trauma. Hemostasis was achieved with aluminium chloride Post-procedure wound care instructions were provided, including keeping the sites clean, applying petrolatum ointment, and avoiding excessive friction or trauma. The patient was informed of potential risks, including bleeding, infection, scarring, and recurrence, and advised to monitor for signs of complications. Specimens were submitted for histopathologic evaluation, and follow-up will be arranged to discuss results and any further management needed.  A- left back 1.2 cm pedunculated pink papule  B- Right back 0.9 cm pink papule  Follow Up in 2 years  I, Darice Smock, CMA, am acting as scribe for RUFUS CHRISTELLA HOLY, MD.   Documentation: I have reviewed the above documentation for accuracy and completeness, and I agree with the above.  RUFUS CHRISTELLA HOLY, MD

## 2023-12-27 NOTE — Patient Instructions (Addendum)

## 2023-12-29 LAB — SURGICAL PATHOLOGY

## 2024-08-06 ENCOUNTER — Other Ambulatory Visit: Payer: Self-pay | Admitting: Family Medicine

## 2024-08-06 ENCOUNTER — Encounter: Payer: Self-pay | Admitting: Family Medicine

## 2024-08-06 DIAGNOSIS — A601 Herpesviral infection of perianal skin and rectum: Secondary | ICD-10-CM

## 2024-08-06 DIAGNOSIS — Z8679 Personal history of other diseases of the circulatory system: Secondary | ICD-10-CM

## 2024-08-06 NOTE — Telephone Encounter (Signed)
 [place] NTBS last OV 06/06/23 NO RFs sent to pharmacy last OV greater than a year

## 2024-08-06 NOTE — Telephone Encounter (Signed)
 Letter sent

## 2024-08-09 ENCOUNTER — Other Ambulatory Visit: Payer: Self-pay | Admitting: Family Medicine

## 2024-08-09 ENCOUNTER — Telehealth: Payer: Self-pay | Admitting: Family Medicine

## 2024-08-09 DIAGNOSIS — Z8679 Personal history of other diseases of the circulatory system: Secondary | ICD-10-CM

## 2024-08-09 DIAGNOSIS — A601 Herpesviral infection of perianal skin and rectum: Secondary | ICD-10-CM

## 2024-08-09 NOTE — Telephone Encounter (Unsigned)
 Copied from CRM [id number]. Topic: General - Billing Inquiry >> Aug 09, 2024  4:20 PM Avram MATSU wrote: Reason for CRM: Pt called about billing and would like to know the balance. Please advise [phone]

## 2024-08-09 NOTE — Telephone Encounter (Signed)
 Copied from CRM [id number]. Topic: Clinical - Medication Question >> Aug 09, 2024  4:18 PM Avram MATSU wrote: Reason for CRM: request for a refill was sent in twice and pt wanted to know if she can get a courtesy refill for metoprolol  succinate (TOPROL -XL) 50 MG 24 hr valACYclovir  (VALTREX ) 500 MG tablet She schedule an appt for 10.8

## 2024-08-29 ENCOUNTER — Ambulatory Visit: Payer: Self-pay | Admitting: Family Medicine

## 2024-08-29 ENCOUNTER — Encounter: Payer: Self-pay | Admitting: Family Medicine

## 2024-08-29 VITALS — BP 110/63 | HR 65 | Temp 97.8°F | Ht 67.0 in | Wt 221.2 lb

## 2024-08-29 DIAGNOSIS — R0683 Snoring: Secondary | ICD-10-CM

## 2024-08-29 DIAGNOSIS — Z6835 Body mass index (BMI) 35.0-35.9, adult: Secondary | ICD-10-CM | POA: Diagnosis not present

## 2024-08-29 DIAGNOSIS — J342 Deviated nasal septum: Secondary | ICD-10-CM

## 2024-08-29 DIAGNOSIS — Z1211 Encounter for screening for malignant neoplasm of colon: Secondary | ICD-10-CM

## 2024-08-29 DIAGNOSIS — Z8679 Personal history of other diseases of the circulatory system: Secondary | ICD-10-CM | POA: Diagnosis not present

## 2024-08-29 DIAGNOSIS — Z0001 Encounter for general adult medical examination with abnormal findings: Secondary | ICD-10-CM

## 2024-08-29 DIAGNOSIS — A601 Herpesviral infection of perianal skin and rectum: Secondary | ICD-10-CM

## 2024-08-29 DIAGNOSIS — F17209 Nicotine dependence, unspecified, with unspecified nicotine-induced disorders: Secondary | ICD-10-CM

## 2024-08-29 DIAGNOSIS — Z0000 Encounter for general adult medical examination without abnormal findings: Secondary | ICD-10-CM

## 2024-08-29 LAB — BAYER DCA HB A1C WAIVED: HB A1C (BAYER DCA - WAIVED): 5.4 % (ref 4.8–5.6)

## 2024-08-29 LAB — LIPID PANEL

## 2024-08-29 MED ORDER — METOPROLOL SUCCINATE ER 50 MG PO TB24: 50.0000 mg | ORAL_TABLET | Freq: Every day | ORAL | 3 refills | Status: AC

## 2024-08-29 MED ORDER — VALACYCLOVIR HCL 500 MG PO TABS: ORAL_TABLET | ORAL | 3 refills | Status: AC

## 2024-08-29 NOTE — Progress Notes (Signed)
 Briana Nielsen is a 53 y.o. female presents to office today for annual physical exam examination.    She is that she has been doing fairly well since her last visit.  She is seeing dermatology, OB/GYN.  She will get her mammogram set up.  She apologizes for failing to show up to [facility] for colonoscopy but notes that she still has a balance there and therefore she has not been able to secure an appoint with them just yet.  She was told that she has a deviated septum and this is why that she is having snoring and in fact she does not have obstructive sleep apnea.  She is hoping to have that fixed next year  She continues to smoke both tobacco and marijuana.  Rare use of alcohol.  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  Occupation: Conservator, museum/gallery, Marital status: married, Substance use: as above Health Maintenance Due  Topic Date Due   Colonoscopy  Never done   Mammogram  06/14/2024   Cervical Cancer Screening (HPV/Pap Cotest)  09/20/2024    Immunization History  Administered Date(s) Administered   Tdap 06/06/2023   Past Medical History:  Diagnosis Date   Arthritis    right knee   Herpes zoster    Menometrorrhagia    Ovarian cyst, left    Social History   Socioeconomic History   Marital status: Married    Spouse name: Briana Nielsen   Number of children: 2   Years of education: Not on file   Highest education level: Associate degree: occupational, Scientist, product/process development, or vocational program  Occupational History   Not on file  Tobacco Use   Smoking status: Former    Types: E-cigarettes    Quit date: 02/22/2012    Years since quitting: 12.5   Smokeless tobacco: Never  Vaping Use   Vaping status: Every Day  Substance and Sexual Activity   Alcohol use: No   Drug use: Yes    Types: Marijuana    Comment: every now & then   Sexual activity: Yes    Birth control/protection: Post-menopausal  Other Topics Concern   Not on file  Social History Narrative   Lives with husband and 68 year old son    Right handed   Caffeine: 3 cups of coffee a day   Social Drivers of Corporate investment banker Strain: Not on file  Food Insecurity: Not on file  Transportation Needs: Not on file  Physical Activity: Not on file  Stress: Not on file  Social Connections: Unknown (04/04/2022)   Received from [facility]   Social Network    Social Network: Not on file  Intimate Partner Violence: Unknown (02/24/2022)   Received from [facility]   HITS    Physically Hurt: Not on file    Insult or Talk Down To: Not on file    Threaten Physical Harm: Not on file    Scream or Curse: Not on file   Past Surgical History:  Procedure Laterality Date   APPENDECTOMY     CESAREAN SECTION     ENDOMETRIAL BIOPSY  03/29/2016   negative   LAPAROSCOPIC OOPHERECTOMY  2000   TUBAL LIGATION     per OB/GYn report   Family History  Problem Relation Age of Onset   Cancer Mother        anal   Dermatomyositis Mother    Mental illness Sister    Drug abuse Sister    Breast cancer Maternal Grandmother 43  Heart attack Father    Pancreatic cancer Maternal Aunt     Current Outpatient Medications:    metoprolol  succinate (TOPROL -XL) 50 MG 24 hr tablet, Take 1 tablet (50 mg total) by mouth daily. Take with or immediately following a meal., Disp: 100 tablet, Rfl: 3   valACYclovir  (VALTREX ) 500 MG tablet, TAKE 1 TABLET BY MOUTH ONCE DAILY FOR  SUPPRESSION  MAY  INCREASE  TO  4  TABLETS  TWICE  DAILY  FOR  1  DAY  PER  BREAKTHROUGH  FLARE, Disp: 135 tablet, Rfl: 3  No Known Allergies   ROS: Review of Systems Pertinent items noted in HPI and remainder of comprehensive ROS otherwise negative.    Physical exam BP 110/63   Pulse 65   Temp 97.8 F (36.6 C)   Ht 5' 7 (1.702 m)   Wt 221 lb 4 oz (100.4 kg)   LMP 06/23/2017   SpO2 96%   BMI 34.65 kg/m  General appearance: alert, cooperative, appears stated age, no distress, and mildly obese Head: Normocephalic, without obvious abnormality, atraumatic Eyes:  negative findings: lids and lashes normal, conjunctivae and sclerae normal, and corneas clear Ears: normal TM's and external ear canals both ears Nose: Deviated septum.  Turbinates appear normal Throat: lips, mucosa, and tongue normal; teeth and gums normal Neck: no adenopathy, no carotid bruit, supple, symmetrical, trachea midline, and thyroid  not enlarged, symmetric, no tenderness/mass/nodules Back: symmetric, no curvature. ROM normal. No CVA tenderness. Lungs: clear to auscultation bilaterally Heart: regular rate and rhythm, S1, S2 normal, no murmur, click, rub or gallop Abdomen: soft, non-tender; bowel sounds normal; no masses,  no organomegaly Extremities: extremities normal, atraumatic, no cyanosis or edema Pulses: 2+ and symmetric Skin: Several flesh-colored papular lesions noted along the posterior neckline Lymph nodes: Cervical, supraclavicular, and axillary nodes normal. Neurologic: Grossly normal except for difficulty hearing  No results found for this or any previous visit (from the past 2160 hours).   Assessment/ Plan: Briana Nielsen here for annual physical exam.   Annual physical exam  History of paroxysmal supraventricular tachycardia - Plan: metoprolol  succinate (TOPROL -XL) 50 MG 24 hr tablet, CMP14+EGFR, CBC with Differential  Herpes simplex infection of perianal skin - Plan: valACYclovir  (VALTREX ) 500 MG tablet, CMP14+EGFR  Colon cancer screening - Plan: Ambulatory referral to Gastroenterology, CMP14+EGFR  Tobacco use disorder, continuous - Plan: CMP14+EGFR, CBC with Differential  Deviated septum - Plan: CMP14+EGFR  Snoring - Plan: CMP14+EGFR  BMI 35.0-35.9,adult - Plan: Lipid Panel, TSH, Bayer DCA Hb A1c Waived   Nonfasting labs collected today.  Medications have been renewed.  She declined all vaccinations.  She will set up mammogram and colonoscopy and a new referral has been placed for colon cancer screening  I counseled her on smoking cessation.  She is  contemplative  I anticipate she will have deviated septum surgery sometime in the spring  Counseled on healthy lifestyle choices, including diet (rich in fruits, vegetables and lean meats and low in salt and simple carbohydrates) and exercise (at least 30 minutes of moderate physical activity daily).  Patient to follow up 1 year for CPE  Demarious Kapur M. Jolinda, DO

## 2024-08-29 NOTE — Patient Instructions (Signed)
 Preventive Care 58-55 Years Old, Female  Preventive care refers to lifestyle choices and visits with your health care provider that can promote health and wellness. Preventive care visits are also called wellness exams.  What can I expect for my preventive care visit?  Counseling  Your health care provider may ask you questions about your:  Medical history, including:  Past medical problems.  Family medical history.  Pregnancy history.  Current health, including:  Menstrual cycle.  Method of birth control.  Emotional well-being.  Home life and relationship well-being.  Sexual activity and sexual health.  Lifestyle, including:  Alcohol, nicotine or tobacco, and drug use.  Access to firearms.  Diet, exercise, and sleep habits.  Work and work Astron[employer].  Sunscreen use.  Safety issues such as seatbelt and bike helmet use.  Physical exam  Your health care provider will check your:  Height and weight. These may be used to calculate your BMI (body mass index). BMI is a measurement that tells if you are at a healthy weight.  Waist circumference. This measures the distance around your waistline. This measurement also tells if you are at a healthy weight and may help predict your risk of certain diseases, such as type 2 diabetes and high blood pressure.  Heart rate and blood pressure.  Body temperature.  Skin for abnormal spots.  What immunizations do I need?    Vaccines are usually given at various ages, according to a schedule. Your health care provider will recommend vaccines for you based on your age, medical history, and lifestyle or other factors, such as travel or where you work.  What tests do I need?  Screening  Your health care provider may recommend screening tests for certain conditions. This may include:  Lipid and cholesterol levels.  Diabetes screening. This is done by checking your blood sugar (glucose) after you have not eaten for a while (fasting).  Pelvic exam and Pap test.  Hepatitis B test.  Hepatitis C  test.  HIV (human immunodeficiency virus) test.  STI (sexually transmitted infection) testing, if you are at risk.  Lung cancer screening.  Colorectal cancer screening.  Mammogram. Talk with your health care provider about when you should start having regular mammograms. This may depend on whether you have a family history of breast cancer.  BRCA-related cancer screening. This may be done if you have a family history of breast, ovarian, tubal, or peritoneal cancers.  Bone density scan. This is done to screen for osteoporosis.  Talk with your health care provider about your test results, treatment options, and if necessary, the need for more tests.  Follow these instructions at home:  Eating and drinking    Eat a diet that includes fresh fruits and vegetables, whole grains, lean protein, and low-fat dairy products.  Take vitamin and mineral supplements as recommended by your health care provider.  Do not drink alcohol if:  Your health care provider tells you not to drink.  You are pregnant, may be pregnant, or are planning to become pregnant.  If you drink alcohol:  Limit how much you have to 0-1 drink a day.  Know how much alcohol is in your drink. In the [place] one drink equals one 12 oz bottle of beer (355 mL), one 5 oz glass of wine (148 mL), or one 1 oz glass of hard liquor (44 mL).  Lifestyle  Brush your teeth every morning and night with fluoride toothpaste. Floss one time each day.  Exercise for at least  30 minutes 5 or more days each week.  Do not use any products that contain nicotine or tobacco. These products include cigarettes, chewing tobacco, and vaping devices, such as e-cigarettes. If you need help quitting, ask your health care provider.  Do not use drugs.  If you are sexually active, practice safe sex. Use a condom or other form of protection to prevent STIs.  If you do not wish to become pregnant, use a form of birth control. If you plan to become pregnant, see your health care provider for a  prepregnancy visit.  Take aspirin only as told by your health care provider. Make sure that you understand how much to take and what form to take. Work with your health care provider to find out whether it is safe and beneficial for you to take aspirin daily.  Find healthy ways to manage stress, such as:  Meditation, yoga, or listening to music.  Journaling.  Talking to a trusted person.  Spending time with friends and family.  Minimize exposure to UV radiation to reduce your risk of skin cancer.  Safety  Always wear your seat belt while driving or riding in a vehicle.  Do not drive:  If you have been drinking alcohol. Do not ride with someone who has been drinking.  When you are tired or distracted.  While texting.  If you have been using any mind-altering substances or drugs.  Wear a helmet and other protective equipment during sports activities.  If you have firearms in your house, make sure you follow all gun safety procedures.  Seek help if you have been physically or sexually abused.  What's next?  Visit your health care provider once a year for an annual wellness visit.  Ask your health care provider how often you should have your eyes and teeth checked.  Stay up to date on all vaccines.  This information is not intended to replace advice given to you by your health care provider. Make sure you discuss any questions you have with your health care provider.  Document Revised: 05/06/2021 Document Reviewed: 05/06/2021  Elsevier Patient Education  2024 [employer].

## 2024-08-30 LAB — CBC WITH DIFFERENTIAL/PLATELET
Basophils Absolute: 0.1 x10E3/uL (ref 0.0–0.2)
Basos: 1 %
EOS (ABSOLUTE): 0.2 x10E3/uL (ref 0.0–0.4)
Eos: 3 %
Hematocrit: 44.7 % (ref 34.0–46.6)
Hemoglobin: 14.8 g/dL (ref 11.1–15.9)
Immature Grans (Abs): 0 x10E3/uL (ref 0.0–0.1)
Immature Granulocytes: 0 %
Lymphocytes Absolute: 2.5 x10E3/uL (ref 0.7–3.1)
Lymphs: 34 %
MCH: 32.7 pg (ref 26.6–33.0)
MCHC: 33.1 g/dL (ref 31.5–35.7)
MCV: 99 fL — ABNORMAL HIGH (ref 79–97)
Monocytes Absolute: 0.8 x10E3/uL (ref 0.1–0.9)
Monocytes: 11 %
Neutrophils Absolute: 3.7 x10E3/uL (ref 1.4–7.0)
Neutrophils: 51 %
Platelets: 268 x10E3/uL (ref 150–450)
RBC: 4.53 x10E6/uL (ref 3.77–5.28)
RDW: 13.2 % (ref 11.7–15.4)
WBC: 7.3 x10E3/uL (ref 3.4–10.8)

## 2024-08-30 LAB — CMP14+EGFR
ALT: 14 IU/L (ref 0–32)
AST: 15 IU/L (ref 0–40)
Albumin: 4.2 g/dL (ref 3.8–4.9)
Alkaline Phosphatase: 89 IU/L (ref 49–135)
BUN/Creatinine Ratio: 10 (ref 9–23)
BUN: 10 mg/dL (ref 6–24)
Bilirubin Total: 0.3 mg/dL (ref 0.0–1.2)
CO2: 26 mmol/L (ref 20–29)
Calcium: 10 mg/dL (ref 8.7–10.2)
Chloride: 103 mmol/L (ref 96–106)
Creatinine, Ser: 0.99 mg/dL (ref 0.57–1.00)
Globulin, Total: 2.3 g/dL (ref 1.5–4.5)
Glucose: 73 mg/dL (ref 70–99)
Potassium: 4.9 mmol/L (ref 3.5–5.2)
Sodium: 142 mmol/L (ref 134–144)
Total Protein: 6.5 g/dL (ref 6.0–8.5)
eGFR: 67 mL/min/1.73 (ref >59)

## 2024-08-30 LAB — LIPID PANEL
Chol/HDL Ratio: 2.8 ratio (ref 0.0–4.4)
Cholesterol, Total: 168 mg/dL (ref 100–199)
HDL: 61 mg/dL
LDL Chol Calc (NIH): 91 mg/dL (ref 0–99)
Triglycerides: 89 mg/dL (ref 0–149)
VLDL Cholesterol Cal: 16 mg/dL (ref 5–40)

## 2024-08-30 LAB — TSH: TSH: 1.06 u[IU]/mL (ref 0.450–4.500)

## 2024-09-10 ENCOUNTER — Other Ambulatory Visit: Payer: Self-pay | Admitting: Family Medicine

## 2024-09-10 DIAGNOSIS — Z1231 Encounter for screening mammogram for malignant neoplasm of breast: Secondary | ICD-10-CM

## 2024-09-12 ENCOUNTER — Ambulatory Visit
Admission: RE | Admit: 2024-09-12 | Discharge: 2024-09-12 | Disposition: A | Payer: Self-pay | Source: Ambulatory Visit | Attending: Family Medicine

## 2024-09-12 DIAGNOSIS — Z1231 Encounter for screening mammogram for malignant neoplasm of breast: Secondary | ICD-10-CM

## 2024-09-17 ENCOUNTER — Ambulatory Visit: Payer: Self-pay | Admitting: Family Medicine

## 2024-09-18 ENCOUNTER — Other Ambulatory Visit: Payer: Self-pay | Admitting: Family Medicine

## 2024-09-18 DIAGNOSIS — R928 Other abnormal and inconclusive findings on diagnostic imaging of breast: Secondary | ICD-10-CM

## 2024-09-19 NOTE — Progress Notes (Signed)
 Appt 12/09/2023

## 2024-09-28 ENCOUNTER — Ambulatory Visit
Admission: RE | Admit: 2024-09-28 | Discharge: 2024-09-28 | Disposition: A | Discharge status: Home / Self Care | Source: Ambulatory Visit | Attending: Family Medicine | Admitting: Family Medicine

## 2024-09-28 ENCOUNTER — Ambulatory Visit: Admission: RE | Admit: 2024-09-28 | Source: Ambulatory Visit

## 2024-09-28 DIAGNOSIS — R92322 Mammographic fibroglandular density, left breast: Secondary | ICD-10-CM | POA: Diagnosis not present

## 2024-09-28 DIAGNOSIS — R928 Other abnormal and inconclusive findings on diagnostic imaging of breast: Secondary | ICD-10-CM | POA: Diagnosis not present

## 2025-09-03 ENCOUNTER — Encounter: Payer: Self-pay | Admitting: Family Medicine
